# Patient Record
Sex: Female | Born: 1960 | Race: White | Hispanic: No | Marital: Married | Smoking: Never smoker
Health system: Southern US, Community
[De-identification: ages and names within clinical notes are randomized; demographics above are authoritative.]

## PROBLEM LIST (undated history)

## (undated) DIAGNOSIS — R87619 Unspecified abnormal cytological findings in specimens from cervix uteri: Secondary | ICD-10-CM

## (undated) DIAGNOSIS — E785 Hyperlipidemia, unspecified: Secondary | ICD-10-CM

## (undated) DIAGNOSIS — N952 Postmenopausal atrophic vaginitis: Secondary | ICD-10-CM

## (undated) DIAGNOSIS — D649 Anemia, unspecified: Secondary | ICD-10-CM

## (undated) DIAGNOSIS — I781 Nevus, non-neoplastic: Secondary | ICD-10-CM

## (undated) DIAGNOSIS — I7300 Raynaud's syndrome without gangrene: Secondary | ICD-10-CM

## (undated) HISTORY — DX: Anemia, unspecified: D64.9

## (undated) HISTORY — DX: Hyperlipidemia, unspecified: E78.5

## (undated) HISTORY — PX: CERVICAL BIOPSY  W/ LOOP ELECTRODE EXCISION: SUR135

## (undated) HISTORY — DX: Unspecified abnormal cytological findings in specimens from cervix uteri: R87.619

## (undated) HISTORY — PX: COLPOSCOPY: SHX161

## (undated) HISTORY — DX: Postmenopausal atrophic vaginitis: N95.2

## (undated) HISTORY — PX: NM RENAL LASIX (ARMC HX): HXRAD1213

## (undated) HISTORY — DX: Nevus, non-neoplastic: I78.1

## (undated) HISTORY — PX: COLONOSCOPY: SHX174

## (undated) HISTORY — DX: Raynaud's syndrome without gangrene: I73.00

---

## 1999-04-25 ENCOUNTER — Other Ambulatory Visit: Admission: RE | Admit: 1999-04-25 | Discharge: 1999-04-25 | Payer: Self-pay | Admitting: Obstetrics and Gynecology

## 1999-08-20 ENCOUNTER — Ambulatory Visit (HOSPITAL_COMMUNITY): Admission: RE | Admit: 1999-08-20 | Discharge: 1999-08-20 | Payer: Self-pay | Admitting: Obstetrics and Gynecology

## 1999-08-20 ENCOUNTER — Encounter: Payer: Self-pay | Admitting: Obstetrics and Gynecology

## 2000-07-13 ENCOUNTER — Other Ambulatory Visit: Admission: RE | Admit: 2000-07-13 | Discharge: 2000-07-13 | Payer: Self-pay | Admitting: Obstetrics and Gynecology

## 2001-03-23 ENCOUNTER — Encounter: Payer: Self-pay | Admitting: Obstetrics and Gynecology

## 2001-03-23 ENCOUNTER — Ambulatory Visit (HOSPITAL_COMMUNITY): Admission: RE | Admit: 2001-03-23 | Discharge: 2001-03-23 | Payer: Self-pay | Admitting: Obstetrics and Gynecology

## 2001-11-16 ENCOUNTER — Other Ambulatory Visit: Admission: RE | Admit: 2001-11-16 | Discharge: 2001-11-16 | Payer: Self-pay | Admitting: Obstetrics and Gynecology

## 2003-03-29 ENCOUNTER — Ambulatory Visit (HOSPITAL_COMMUNITY): Admission: RE | Admit: 2003-03-29 | Discharge: 2003-03-29 | Payer: Self-pay | Admitting: Obstetrics and Gynecology

## 2005-05-12 ENCOUNTER — Ambulatory Visit (HOSPITAL_COMMUNITY): Admission: RE | Admit: 2005-05-12 | Discharge: 2005-05-12 | Payer: Self-pay | Admitting: Obstetrics and Gynecology

## 2006-11-09 ENCOUNTER — Ambulatory Visit (HOSPITAL_COMMUNITY): Admission: RE | Admit: 2006-11-09 | Discharge: 2006-11-09 | Payer: Self-pay | Admitting: Obstetrics and Gynecology

## 2008-01-12 ENCOUNTER — Ambulatory Visit (HOSPITAL_COMMUNITY): Admission: RE | Admit: 2008-01-12 | Discharge: 2008-01-12 | Payer: Self-pay | Admitting: Obstetrics and Gynecology

## 2009-01-23 ENCOUNTER — Ambulatory Visit (HOSPITAL_COMMUNITY): Admission: RE | Admit: 2009-01-23 | Discharge: 2009-01-23 | Payer: Self-pay | Admitting: Obstetrics and Gynecology

## 2010-04-11 ENCOUNTER — Ambulatory Visit (HOSPITAL_COMMUNITY)
Admission: RE | Admit: 2010-04-11 | Discharge: 2010-04-11 | Payer: Self-pay | Source: Home / Self Care | Attending: Obstetrics and Gynecology | Admitting: Obstetrics and Gynecology

## 2011-04-03 ENCOUNTER — Other Ambulatory Visit (HOSPITAL_COMMUNITY): Payer: Self-pay | Admitting: Obstetrics & Gynecology

## 2011-04-03 DIAGNOSIS — Z1231 Encounter for screening mammogram for malignant neoplasm of breast: Secondary | ICD-10-CM

## 2011-04-25 ENCOUNTER — Ambulatory Visit (HOSPITAL_COMMUNITY)
Admission: RE | Admit: 2011-04-25 | Discharge: 2011-04-25 | Disposition: A | Discharge status: Home / Self Care | Payer: Commercial Indemnity | Source: Ambulatory Visit | Attending: Obstetrics & Gynecology | Admitting: Obstetrics & Gynecology

## 2011-04-25 ENCOUNTER — Other Ambulatory Visit: Payer: Self-pay | Admitting: Obstetrics & Gynecology

## 2011-04-25 DIAGNOSIS — N63 Unspecified lump in unspecified breast: Secondary | ICD-10-CM

## 2011-04-25 DIAGNOSIS — Z1231 Encounter for screening mammogram for malignant neoplasm of breast: Secondary | ICD-10-CM

## 2011-05-27 ENCOUNTER — Other Ambulatory Visit: Payer: Self-pay | Admitting: Obstetrics & Gynecology

## 2011-05-27 DIAGNOSIS — N63 Unspecified lump in unspecified breast: Secondary | ICD-10-CM

## 2011-06-04 ENCOUNTER — Ambulatory Visit
Admission: RE | Admit: 2011-06-04 | Discharge: 2011-06-04 | Disposition: A | Discharge status: Home / Self Care | Payer: Commercial Indemnity | Source: Ambulatory Visit | Attending: Obstetrics & Gynecology | Admitting: Obstetrics & Gynecology

## 2011-06-04 ENCOUNTER — Other Ambulatory Visit: Payer: Self-pay

## 2011-06-04 DIAGNOSIS — N63 Unspecified lump in unspecified breast: Secondary | ICD-10-CM

## 2011-06-17 ENCOUNTER — Ambulatory Visit (INDEPENDENT_AMBULATORY_CARE_PROVIDER_SITE_OTHER): Payer: Commercial Indemnity | Admitting: Family Medicine

## 2011-06-17 VITALS — BP 111/70 | HR 65 | Temp 98.3°F | Resp 16 | Ht 68.0 in | Wt 148.0 lb

## 2011-06-17 DIAGNOSIS — R059 Cough, unspecified: Secondary | ICD-10-CM

## 2011-06-17 DIAGNOSIS — R05 Cough: Secondary | ICD-10-CM

## 2011-06-17 MED ORDER — CEFDINIR 300 MG PO CAPS: 300.0000 mg | ORAL_CAPSULE | Freq: Two times a day (BID) | ORAL | Status: AC

## 2011-06-17 NOTE — Progress Notes (Signed)
  Patient Name: April Cowan Date of Birth: [birth year] Medical Record Number: [id number] Gender: female Date of Encounter: 06/17/2011  History of Present Illness:  April Cowan is a 51 y.o. very pleasant female patient who presents with the following:  Here with illness for a couple of days- she has been traveling and flying a lot.  She has a somewhat productive but painful cough, also sinus congestion and PND.  No earache or ST, no fever.  Some aches and a few chills.  No GI symptoms.   No tobacco.  She is generally quite healthy and has no other issues  There is no problem list on file for this patient.  No past medical history on file. No past surgical history on file. History  Substance Use Topics  . Smoking status: Never Smoker   . Smokeless tobacco: Not on file  . Alcohol Use: Not on file   No family history on file. No Known Allergies  Medication list has been reviewed and updated.  Review of Systems: As per HPI- otherwise negative.   Physical Examination: Filed Vitals:   06/17/11 1059  BP: 111/70  Pulse: 65  Temp: 98.3 F (36.8 C)  Resp: 16  Height: 5\' 8"  (1.727 m)  Weight: 148 lb (67.132 kg)  SpO2: 99%    Body mass index is 22.50 kg/(m^2).  GEN: WDWN, NAD, Non-toxic, A & O x 3 HEENT: Atraumatic, Normocephalic. Neck supple. No masses, No LAD.  TM, oropharynx, nasal cavity wnl, PEERL Ears and Nose: No external deformity. CV: RRR, No M/G/R. No JVD. No thrill. No extra heart sounds. PULM: CTA B, no wheezes, crackles, rhonchi. No retractions. No resp. distress. No accessory muscle use. ABD: S, NT, ND, +BS. No rebound. No HSM. EXTR: No c/c/e NEURO Normal gait.  PSYCH: Normally interactive. Conversant. Not depressed or anxious appearing.  Calm demeanor.    Assessment and Plan: 1. Cough  cefdinir (OMNICEF) 300 MG capsule   Suspect that April Cowan currently has a viral infection- however I did give her a paper Rx for omincef to hold on to- she can use this if  she is not better in the next few days.  Feel free to call if not getting better- Sooner if worse.   For now continue supportive measures and OTC medications

## 2012-05-27 ENCOUNTER — Other Ambulatory Visit: Payer: Self-pay

## 2012-05-27 DIAGNOSIS — Z1231 Encounter for screening mammogram for malignant neoplasm of breast: Secondary | ICD-10-CM

## 2012-06-22 ENCOUNTER — Ambulatory Visit
Admission: RE | Admit: 2012-06-22 | Discharge: 2012-06-22 | Disposition: A | Discharge status: Home / Self Care | Payer: Commercial Indemnity | Source: Ambulatory Visit

## 2012-06-22 DIAGNOSIS — Z1231 Encounter for screening mammogram for malignant neoplasm of breast: Secondary | ICD-10-CM

## 2013-05-09 ENCOUNTER — Encounter: Payer: Self-pay | Admitting: Family Medicine

## 2013-05-09 ENCOUNTER — Ambulatory Visit (INDEPENDENT_AMBULATORY_CARE_PROVIDER_SITE_OTHER): Payer: Commercial Indemnity | Admitting: Family Medicine

## 2013-05-09 ENCOUNTER — Encounter (INDEPENDENT_AMBULATORY_CARE_PROVIDER_SITE_OTHER): Payer: Self-pay

## 2013-05-09 VITALS — BP 90/64 | Temp 97.9°F | Ht 68.0 in | Wt 147.0 lb

## 2013-05-09 DIAGNOSIS — Z7189 Other specified counseling: Secondary | ICD-10-CM

## 2013-05-09 DIAGNOSIS — Z7689 Persons encountering health services in other specified circumstances: Secondary | ICD-10-CM

## 2013-05-09 DIAGNOSIS — Z1211 Encounter for screening for malignant neoplasm of colon: Secondary | ICD-10-CM

## 2013-05-09 NOTE — Patient Instructions (Addendum)
-  We placed a referral for you as discussed. It usually takes about 1-2 weeks to process and schedule this referral. If you have not heard from [place] regarding this appointment in 2 weeks please contact our office.  -1000 IU of Vit D3 daily; 1200mg  daily calcium total in food and in supplement  -PLEASE SIGN UP FOR [place]   We recommend the following healthy lifestyle measures: - eat a healthy diet consisting of lots of vegetables, fruits, beans, nuts, seeds, healthy meats such as white chicken and fish and whole grains.  - avoid fried foods, fast food, processed foods, sodas, red meet and other fattening foods.  - get a least 150 minutes of aerobic exercise per week.   Follow up in: 1 year or as needed

## 2013-05-09 NOTE — Progress Notes (Signed)
Pre visit review using our clinic review tool, if applicable. No additional management support is needed unless otherwise documented below in the visit note.,  

## 2013-05-09 NOTE — Progress Notes (Signed)
Chief Complaint  Patient presents with  . Establish Care    HPI:  April Cowan is here to establish care.  Last PCP and physical: Sees Dr. Lonia Skinner - UTD on physicals and labs.  Has the following chronic problems and concerns today:  There are no active problems to display for this patient.  Wants to get colon cancer screening.  Health Maintenance: -she will find out about tdap  ROS: See pertinent positives and negatives per HPI.  Past Medical History  Diagnosis Date  . Vaginal atrophy   . Spider veins     Family History  Problem Relation Age of Onset  . Arthritis Paternal Grandmother   . Colon cancer Paternal Grandmother   . Breast cancer Paternal Grandmother   . Lung cancer Paternal Grandfather   . Hyperlipidemia Mother   . Heart disease Father   . Hypertension Father   . Atrial fibrillation Father     History   Social History  . Marital Status: Married    Spouse Name: N/A    Number of Children: N/A  . Years of Education: N/A   Social History Main Topics  . Smoking status: Never Smoker   . Smokeless tobacco: None  . Alcohol Use: Yes     Comment: occ  . Drug Use: None  . Sexual Activity: None   Other Topics Concern  . 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  EXAMDanley Danker Vitals:   05/09/13 0828  BP: 90/64  Temp: 97.9 F (36.6 C)    Body mass index is 22.36 kg/(m^2).  GENERAL: vitals reviewed and listed above, alert, oriented, appears well hydrated and in no acute distress  HEENT: atraumatic, conjunttiva clear, no obvious abnormalities on inspection of external nose and ears  NECK: no obvious masses on inspection  LUNGS: clear to auscultation bilaterally, no wheezes, rales or rhonchi, good air  movement  CV: HRRR, no peripheral edema  MS: moves all extremities without noticeable abnormality  PSYCH: pleasant and cooperative, no obvious depression or anxiety  ASSESSMENT AND PLAN:  Discussed the following assessment and plan:  Colon cancer screening - Plan: Ambulatory referral to Gastroenterology  Encounter to establish care  -We reviewed the PMH, PSH, FH, SH, Meds and Allergies. -We provided refills for any medications we will prescribe as needed. -We addressed current concerns per orders and patient instructions. -We have asked for records for pertinent exams, studies, vaccines and notes from previous providers. -We have advised patient to follow up per instructions below. -referred for colonoscopy  -Patient advised to return or notify a doctor immediately if symptoms worsen or persist or new concerns arise.  Patient Instructions  -We placed a referral for you as discussed. It usually takes about 1-2 weeks to process and schedule this referral. If you have not heard from [place] regarding this appointment in 2 weeks please contact our office.  -1000 IU of Vit D3 daily; 1200mg  daily calcium total in food and in supplement  -PLEASE SIGN UP FOR [place]   We recommend the following healthy lifestyle measures: - eat a healthy diet consisting of lots of vegetables, fruits, beans, nuts, seeds, healthy meats  such as white chicken and fish and whole grains.  - avoid fried foods, fast food, processed foods, sodas, red meet and other fattening foods.  - get a least 150 minutes of aerobic exercise per week.   Follow up in: 1 year or as needed      Faron Whitelock, Jarrett Soho R.

## 2013-07-11 ENCOUNTER — Encounter: Payer: Self-pay | Admitting: Family Medicine

## 2013-08-11 ENCOUNTER — Other Ambulatory Visit: Payer: Self-pay

## 2013-08-11 DIAGNOSIS — Z1231 Encounter for screening mammogram for malignant neoplasm of breast: Secondary | ICD-10-CM

## 2013-08-19 ENCOUNTER — Encounter (INDEPENDENT_AMBULATORY_CARE_PROVIDER_SITE_OTHER): Payer: Self-pay

## 2013-08-19 ENCOUNTER — Ambulatory Visit
Admission: RE | Admit: 2013-08-19 | Discharge: 2013-08-19 | Disposition: A | Discharge status: Home / Self Care | Payer: Managed Care, Other (non HMO) | Source: Ambulatory Visit

## 2013-08-19 DIAGNOSIS — Z1231 Encounter for screening mammogram for malignant neoplasm of breast: Secondary | ICD-10-CM

## 2015-02-01 ENCOUNTER — Encounter (HOSPITAL_BASED_OUTPATIENT_CLINIC_OR_DEPARTMENT_OTHER): Payer: Self-pay | Admitting: *Deleted

## 2015-02-01 ENCOUNTER — Emergency Department (HOSPITAL_BASED_OUTPATIENT_CLINIC_OR_DEPARTMENT_OTHER): Payer: Managed Care, Other (non HMO)

## 2015-02-01 ENCOUNTER — Emergency Department (HOSPITAL_BASED_OUTPATIENT_CLINIC_OR_DEPARTMENT_OTHER)
Admission: EM | Admit: 2015-02-01 | Discharge: 2015-02-01 | Disposition: A | Discharge status: Home / Self Care | Payer: Managed Care, Other (non HMO) | Attending: Emergency Medicine | Admitting: Emergency Medicine

## 2015-02-01 DIAGNOSIS — R202 Paresthesia of skin: Secondary | ICD-10-CM | POA: Insufficient documentation

## 2015-02-01 DIAGNOSIS — R200 Anesthesia of skin: Secondary | ICD-10-CM | POA: Diagnosis not present

## 2015-02-01 DIAGNOSIS — R000 Tachycardia, unspecified: Secondary | ICD-10-CM | POA: Diagnosis not present

## 2015-02-01 DIAGNOSIS — Z87448 Personal history of other diseases of urinary system: Secondary | ICD-10-CM | POA: Diagnosis not present

## 2015-02-01 DIAGNOSIS — Z563 Stressful work schedule: Secondary | ICD-10-CM | POA: Insufficient documentation

## 2015-02-01 DIAGNOSIS — Z8679 Personal history of other diseases of the circulatory system: Secondary | ICD-10-CM | POA: Diagnosis not present

## 2015-02-01 DIAGNOSIS — Z566 Other physical and mental strain related to work: Secondary | ICD-10-CM

## 2015-02-01 DIAGNOSIS — R002 Palpitations: Secondary | ICD-10-CM | POA: Insufficient documentation

## 2015-02-01 LAB — BASIC METABOLIC PANEL
Anion gap: 9 (ref 5–15)
BUN: 14 mg/dL (ref 6–20)
CO2: 29 mmol/L (ref 22–32)
Calcium: 9.4 mg/dL (ref 8.9–10.3)
Chloride: 102 mmol/L (ref 101–111)
Creatinine, Ser: 0.73 mg/dL (ref 0.44–1.00)
GFR calc Af Amer: >60 mL/min (ref >60)
GFR calc non Af Amer: >60 mL/min (ref >60)
Glucose, Bld: 130 mg/dL — ABNORMAL HIGH (ref 65–99)
Potassium: 4.3 mmol/L (ref 3.5–5.1)
Sodium: 140 mmol/L (ref 135–145)

## 2015-02-01 LAB — CBC WITH DIFFERENTIAL/PLATELET
Basophils Absolute: 0 10*3/uL (ref 0.0–0.1)
Basophils Relative: 0 %
Eosinophils Absolute: 0.1 10*3/uL (ref 0.0–0.7)
Eosinophils Relative: 1 %
HCT: 41 % (ref 36.0–46.0)
Hemoglobin: 13.8 g/dL (ref 12.0–15.0)
Lymphocytes Relative: 20 %
Lymphs Abs: 1.3 10*3/uL (ref 0.7–4.0)
MCH: 32 pg (ref 26.0–34.0)
MCHC: 33.7 g/dL (ref 30.0–36.0)
MCV: 95.1 fL (ref 78.0–100.0)
Monocytes Absolute: 0.5 10*3/uL (ref 0.1–1.0)
Monocytes Relative: 8 %
Neutro Abs: 4.6 10*3/uL (ref 1.7–7.7)
Neutrophils Relative %: 71 %
Platelets: 196 10*3/uL (ref 150–400)
RBC: 4.31 MIL/uL (ref 3.87–5.11)
RDW: 11.8 % (ref 11.5–15.5)
WBC: 6.4 10*3/uL (ref 4.0–10.5)

## 2015-02-01 LAB — TROPONIN I: Troponin I: <0.03 ng/mL (ref <0.031)

## 2015-02-01 NOTE — Discharge Instructions (Signed)

## 2015-02-01 NOTE — ED Provider Notes (Signed)
CSN: [id number]     Arrival date & time 02/01/15  0910 History   First MD Initiated Contact with Patient 02/01/15 [phone]     No chief complaint on file.    (Consider location/radiation/quality/duration/timing/severity/associated sxs/prior Treatment) HPI Comments: Patient is a 54 year old female with no prior medical history, takes no medications at home, no cigarette use and drinks 2 glasses of wine per day who is presenting today with one month of left arm tingling and numbness which was present today when she woke up that developed into palpitations while she was driving to work at a general sense of not feeling well. Patient denied any chest pain at any point of time, nausea, vomiting or shortness of breath. She states she's been under an incredible amount of stress lately at work for the last 1 month which she is concerned may be part of the problem. She has a history of Reynauds disease but denies any left arm swelling, weakness. No lower extremity symptoms or headache. No significant family history for MI. Patient last saw her PCP about 1-1/2 years ago and everything was normal at that time. Denies excessive weight gain or weight loss.  The history is provided by the patient and the spouse.    Past Medical History  Diagnosis Date  . Vaginal atrophy   . Spider veins    History reviewed. No pertinent past surgical history. Family History  Problem Relation Age of Onset  . Arthritis Paternal Grandmother   . Colon cancer Paternal Grandmother   . Breast cancer Paternal Grandmother   . Lung cancer Paternal Grandfather   . Hyperlipidemia Mother   . Heart disease Father   . Hypertension Father   . Atrial fibrillation Father    Social History  Substance Use Topics  . Smoking status: Never Smoker   . Smokeless tobacco: None  . Alcohol Use: Yes     Comment: occ   OB History    No data available     Review of Systems  All other systems reviewed and are negative.     Allergies   Review of patient's allergies indicates no known allergies.  Home Medications   Prior to Admission medications   Not on File   BP 113/80 mmHg  Pulse 80  Temp(Src) 98.3 F (36.8 C) (Oral)  Resp 16  SpO2 98%  LMP 04/07/2012 Physical Exam  Constitutional: She is oriented to person, place, and time. [name]. No distress.  HENT:  Head: Normocephalic and atraumatic.  Mouth/Throat: Oropharynx is clear and moist.  Eyes: Conjunctivae and EOM are normal. Pupils are equal, round, and reactive to light.  Neck: Normal range of motion. Neck supple.  Cardiovascular: Regular rhythm and intact distal pulses.  Tachycardia present.   No murmur heard. Pulses are equal in bilateral upper extremities. It initially appears anxious and worried however tachycardia improves with rest and relaxing  Pulmonary/Chest: Effort normal and breath sounds normal. No respiratory distress. She has no wheezes. She has no rales.  Abdominal: Soft. She exhibits no distension. There is no tenderness. There is no rebound and no guarding.  Musculoskeletal: Normal range of motion. She exhibits no edema or tenderness.  Neurological: She is alert and oriented to person, place, and time.  Skin: Skin is warm and dry. No rash noted. No erythema.  Psychiatric: She has a normal mood and affect. Her behavior is normal.  Nursing note and vitals reviewed.   ED Course  Procedures (including critical care time) Labs  Review Labs Reviewed  BASIC METABOLIC PANEL - Abnormal; Notable for the following:    Glucose, Bld 130 (*)    All other components within normal limits  CBC WITH DIFFERENTIAL/PLATELET  TROPONIN I  TSH    Imaging Review Dg Chest 2 View  02/01/2015  CLINICAL DATA:  Left-sided chest pain and tachycardia. Left arm numbness. EXAM: CHEST  2 VIEW COMPARISON:  None. FINDINGS: Lungs are clear. Heart size and pulmonary vascularity are normal. No adenopathy. There is thoracolumbar  levoscoliosis. No pneumothorax. IMPRESSION: No edema or consolidation. Electronically Signed   By: Lowella Grip III M.D.   On: 02/01/2015 10:13   I have personally reviewed and evaluated these images and lab results as part of my medical decision-making.   EKG Interpretation   Date/Time:  Thursday February 01 2015 10:19:01 EST Ventricular Rate:  105 PR Interval:  150 QRS Duration: 76 QT Interval:  360 QTC Calculation: 475 R Axis:   77 Text Interpretation:  Sinus tachycardia with occasional Premature  ventricular complexes Otherwise normal ECG No previous tracing Confirmed  by Maryan Rued  MD, Loree Fee ([id number]) on 02/01/2015 9:25:05 AM      MDM   Final diagnoses:  Palpitation  Stress at work    Patient is a 54 year old female with no significant medical history who presents today with one month of intermittent arm tingling and discomfort in the development of palpitations today that sounds most like a panic attack. Per her husband she has been under a significant amount of stress lately at work which is affecting her entire life. She has no prior medical history or cardiac risk factors. Her arm symptoms over the last month have never been exertional in nature. They're usually more present in the morning when she wakes up or when she relaxes in the evening. She has been drinking at least 2 glasses of wine daily for the last month. Heart score of 1 for age only. She denies any chest pain. Low suspicion for dissection. No risk factors for PE. EKG shows a sinus tachycardia with occasional PVCs. With rest alone patient's heart rate improves to the 80s and repeat EKG was within normal limits. Low suspicion at this time for coronary artery dissection or left arm DVT or arterial obstruction.  Chest x-ray within normal limits, CBC, BMP and troponin are within normal limits. TSH is pending. Feel that patient is safe to go home. Discussed with her symptoms are ongoing she needs to follow-up with her  doctor for further evaluation and may even require stress testing in the future    Blanchie Dessert, MD 02/02/15 [phone]

## 2015-02-01 NOTE — ED Notes (Addendum)
C/o tingling in left arm x 1 month.  C/o stress lately.  C/o now of aching in left arm and rapid hrt rate. No sob or nausea. No pain in chest. Pt took she took a baby asa this am.

## 2015-02-02 ENCOUNTER — Other Ambulatory Visit: Payer: Self-pay

## 2015-02-02 ENCOUNTER — Ambulatory Visit
Admission: RE | Admit: 2015-02-02 | Discharge: 2015-02-02 | Disposition: A | Discharge status: Home / Self Care | Payer: Managed Care, Other (non HMO) | Source: Ambulatory Visit

## 2015-02-02 DIAGNOSIS — Z1231 Encounter for screening mammogram for malignant neoplasm of breast: Secondary | ICD-10-CM

## 2015-02-02 LAB — TSH: TSH: 1.283 u[IU]/mL (ref 0.350–4.500)

## 2015-06-18 ENCOUNTER — Encounter: Payer: Managed Care, Other (non HMO) | Admitting: Family Medicine

## 2015-06-22 ENCOUNTER — Encounter: Payer: Managed Care, Other (non HMO) | Admitting: Family Medicine

## 2015-11-30 ENCOUNTER — Ambulatory Visit (INDEPENDENT_AMBULATORY_CARE_PROVIDER_SITE_OTHER): Payer: Managed Care, Other (non HMO) | Admitting: Family Medicine

## 2015-11-30 ENCOUNTER — Encounter: Payer: Self-pay | Admitting: Family Medicine

## 2015-11-30 VITALS — BP 116/80 | HR 81 | Temp 98.1°F | Ht 67.0 in | Wt 147.5 lb

## 2015-11-30 DIAGNOSIS — Z23 Encounter for immunization: Secondary | ICD-10-CM

## 2015-11-30 DIAGNOSIS — Z0000 Encounter for general adult medical examination without abnormal findings: Secondary | ICD-10-CM | POA: Diagnosis not present

## 2015-11-30 LAB — LIPID PANEL
Cholesterol: 247 mg/dL — ABNORMAL HIGH (ref 0–200)
HDL: 101.2 mg/dL (ref >39.00)
LDL Cholesterol: 132 mg/dL — ABNORMAL HIGH (ref 0–99)
NonHDL: 146.18
Total CHOL/HDL Ratio: 2
Triglycerides: 70 mg/dL (ref 0.0–149.0)
VLDL: 14 mg/dL (ref 0.0–40.0)

## 2015-11-30 LAB — HEMOGLOBIN A1C: Hgb A1c MFr Bld: 5.1 % (ref 4.6–6.5)

## 2015-11-30 NOTE — Addendum Note (Signed)
Addended by: Agnes Lawrence on: 11/30/2015 09:49 AM   Modules accepted: Orders

## 2015-11-30 NOTE — Progress Notes (Signed)
HPI:  Here for CPE:  -Concerns and/or follow up today: none Had cbc, cmp and thyroid 03/2015 with [facility] - normal. Had some anxiety last year and one panic attack her whole life and had eval in ER - exercise and relaxation have helped and other then some generalized mild worry and sleep issues is doing well.  -Diet: variety of foods, balance and well rounded  -Exercise: regular exercise  -Taking folic acid, vitamin D or calcium: no  -Diabetes and Dyslipidemia Screening: labs today  -Hx of HTN: no  -Vaccines: wants to do flu and tdap today  -pap history: sees Dr. Lonia Skinner - does women's exams, paps, breast exams and mammos with gyn  -sexual activity: yes, female partner, no new partners  -wants STI testing (Hep C if born [birth year]): wants hiv and hep c  -FH breast, colon or ovarian ca: see FH Last mammogram: 01/2015 birads 1 Last colon cancer screening: not done, wants to do cologuard  -Alcohol, Tobacco, drug use: see social history  Review of Systems - no fevers, unintentional weight loss, vision loss, hearing loss, chest pain, sob, hemoptysis, melena, hematochezia, hematuria, genital discharge, changing or concerning skin lesions, bleeding, bruising, loc, thoughts of self harm or SI  Past Medical History:  Diagnosis Date  . Spider veins   . Vaginal atrophy     No past surgical history on file.  Family History  Problem Relation Age of Onset  . Arthritis Paternal Grandmother   . Colon cancer Paternal Grandmother   . Breast cancer Paternal Grandmother   . Lung cancer Paternal Grandfather   . Hyperlipidemia Mother   . Heart disease Father   . Hypertension Father   . Atrial fibrillation Father     Social History   Social History  . Marital status: Married    Spouse name: N/A  . Number of children: N/A  . Years of education: N/A   Social History Main Topics  . Smoking status: Never Smoker  . Smokeless tobacco: None  . Alcohol use Yes     Comment: occ   . Drug use: Unknown  . Sexual activity: Not Asked   Other Topics Concern  . 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  EXAM:  Vitals:   11/30/15 0855  BP: 116/80  Pulse: 81  Temp: 98.1 F (36.7 C)    GENERAL: vitals reviewed and listed below, alert, oriented, appears well hydrated and in no acute distress  HEENT: head atraumatic, PERRLA, normal appearance of eyes, ears, nose and mouth. moist mucus membranes.  NECK: supple, no masses or lymphadenopathy  LUNGS: clear to auscultation bilaterally, no rales, rhonchi or wheeze  CV: HRRR, no peripheral edema or cyanosis, normal pedal pulses  BREAST: declined, does with gyn  ABDOMEN: bowel sounds normal, soft, non tender to palpation, no masses, no rebound or guarding  GU: declined, does with gyn  SKIN: no rash or abnormal lesions, declined full skin exam - does with gyn  MS: normal gait, moves all extremities normally  NEURO: normal gait, speech and thought processing grossly intact, muscle tone grossly intact throughout  PSYCH: normal affect, pleasant and cooperative  ASSESSMENT AND PLAN:  Discussed the following assessment and plan:  Visit for preventive health examination - Plan: Hep C Antibody, HIV antibody (with reflex), Lipid Panel, Hemoglobin A1c  -Discussed and advised all [place] preventive services health task force level A and B recommendations for age, sex and risks.  -Advised at least 150 minutes of exercise per week and a healthy diet with avoidance of (less then 1 serving per week) processed foods, white starches, red meat, fast foods and sweets and consisting of: * 5-9 servings of fresh fruits and vegetables (not corn or potatoes) *nuts and seeds, beans *olives and olive  oil *lean meats such as fish and white chicken  *whole grains  -labs, studies and vaccines per orders this encounter  Orders Placed This Encounter  Procedures  . Hep C Antibody  . HIV antibody (with reflex)  . Lipid Panel  . Hemoglobin A1c    Patient advised to return to clinic immediately if symptoms worsen or persist or new concerns.  Patient Instructions  BEFORE YOU LEAVE: -follow up: yearly and as needed -flu shot, Tdap -order cologuard  Vit D3 [phone] IU daily  We have ordered labs or studies at this visit. It can take up to 1-2 weeks for results and processing. IF results require follow up or explanation, we will call you with instructions. Clinically stable results will be released to your [facility]. If you have not heard from [place] or cannot find your results in [facility] in 2 weeks please contact our office at [phone].  If you are not yet signed up for [facility], please consider signing up.   We recommend the following healthy lifestyle for LIFE: 1) Small portions.   Tip: eat off of a salad plate instead of a dinner plate.  Tip: It is ok to feel hungry after a meal - that likely means you ate an appropriate portion.  Tip: if you need more or a snack choose fruits, veggies and/or a handful of nuts or seeds.  2) Eat a healthy clean diet.  * Tip: Avoid (less then 1 serving per week): processed foods, sweets, sweetened drinks, white starches (rice, flour, bread, potatoes, pasta, etc), red meat, fast foods, butter  *Tip: CHOOSE instead   * 5-9 servings per day of fresh or frozen fruits and vegetables (but not corn, potatoes, bananas, canned or dried fruit)   *nuts and seeds, beans   *olives and olive oil   *small portions of lean meats such as fish and white chicken    *small portions of whole grains  3)Get at least 150 minutes of sweaty aerobic exercise per week.  4)Reduce stress - consider counseling, meditation and relaxation to balance other aspects of your  life.   FOR IMPROVED SLEEP AND TO RESET YOUR SLEEP SCHEDULE: []  exercise 30 minutes daily  []  go to bed and wake up at the same time  []  keep bedroom cool, dark and quiet  []  reserve bed for sleep - do not read, watch TV, etc in bed  []  If you toss and turn more then 15-20 minutes get out of bed and list thoughts/do quite activity then go back to bed; repeat as needed; do not worry about when you eventually fall asleep - still get up at the same time and turn on lights and take shower  [] get counseling, cognitive behavioral therapy - there are several phone apps for this if you are not able to see a therapist  []  some people find that a half dose of benadryl, melatonin, tylenol  pm or unisom on a few nights per week is helpful initially for a few weeks  [] seek help and treat any depression or anxiety  [] prescription strength sleep medications should only be used in severe cases of insomnia if other measures fail and should be used sparingly        No Follow-up on file.  Colin Benton R., DO

## 2015-11-30 NOTE — Patient Instructions (Signed)
BEFORE YOU LEAVE: -follow up: yearly and as needed -flu shot, Tdap -order cologuard  Vit D3 [phone] IU daily  We have ordered labs or studies at this visit. It can take up to 1-2 weeks for results and processing. IF results require follow up or explanation, we will call you with instructions. Clinically stable results will be released to your [facility]. If you have not heard from [place] or cannot find your results in [facility] in 2 weeks please contact our office at [phone].  If you are not yet signed up for [facility], please consider signing up.   We recommend the following healthy lifestyle for LIFE: 1) Small portions.   Tip: eat off of a salad plate instead of a dinner plate.  Tip: It is ok to feel hungry after a meal - that likely means you ate an appropriate portion.  Tip: if you need more or a snack choose fruits, veggies and/or a handful of nuts or seeds.  2) Eat a healthy clean diet.  * Tip: Avoid (less then 1 serving per week): processed foods, sweets, sweetened drinks, white starches (rice, flour, bread, potatoes, pasta, etc), red meat, fast foods, butter  *Tip: CHOOSE instead   * 5-9 servings per day of fresh or frozen fruits and vegetables (but not corn, potatoes, bananas, canned or dried fruit)   *nuts and seeds, beans   *olives and olive oil   *small portions of lean meats such as fish and white chicken    *small portions of whole grains  3)Get at least 150 minutes of sweaty aerobic exercise per week.  4)Reduce stress - consider counseling, meditation and relaxation to balance other aspects of your life.   FOR IMPROVED SLEEP AND TO RESET YOUR SLEEP SCHEDULE: []  exercise 30 minutes daily  []  go to bed and wake up at the same time  []  keep bedroom cool, dark and quiet  []  reserve bed for sleep - do not read, watch TV, etc in bed  []  If you toss and turn more then 15-20 minutes get out of bed and list thoughts/do quite activity then go back to bed; repeat as needed; do  not worry about when you eventually fall asleep - still get up at the same time and turn on lights and take shower  [] get counseling, cognitive behavioral therapy - there are several phone apps for this if you are not able to see a therapist  []  some people find that a half dose of benadryl, melatonin, tylenol pm or unisom on a few nights per week is helpful initially for a few weeks  [] seek help and treat any depression or anxiety  [] prescription strength sleep medications should only be used in severe cases of insomnia if other measures fail and should be used sparingly

## 2015-12-01 LAB — HEPATITIS C ANTIBODY: HCV Ab: NEGATIVE

## 2015-12-01 LAB — HIV ANTIBODY (ROUTINE TESTING W REFLEX): HIV 1&2 Ab, 4th Generation: NONREACTIVE

## 2016-03-14 ENCOUNTER — Other Ambulatory Visit: Payer: Self-pay | Admitting: Obstetrics & Gynecology

## 2016-03-14 DIAGNOSIS — Z1231 Encounter for screening mammogram for malignant neoplasm of breast: Secondary | ICD-10-CM

## 2016-04-10 ENCOUNTER — Ambulatory Visit: Payer: Managed Care, Other (non HMO)

## 2016-04-15 ENCOUNTER — Ambulatory Visit: Payer: Managed Care, Other (non HMO)

## 2016-04-15 ENCOUNTER — Other Ambulatory Visit: Payer: Self-pay | Admitting: Obstetrics & Gynecology

## 2016-04-15 ENCOUNTER — Ambulatory Visit
Admission: RE | Admit: 2016-04-15 | Discharge: 2016-04-15 | Disposition: A | Discharge status: Home / Self Care | Payer: Managed Care, Other (non HMO) | Source: Ambulatory Visit | Attending: Obstetrics & Gynecology | Admitting: Obstetrics & Gynecology

## 2016-04-15 DIAGNOSIS — Z1231 Encounter for screening mammogram for malignant neoplasm of breast: Secondary | ICD-10-CM

## 2016-04-15 DIAGNOSIS — N6320 Unspecified lump in the left breast, unspecified quadrant: Secondary | ICD-10-CM

## 2016-04-16 ENCOUNTER — Ambulatory Visit
Admission: RE | Admit: 2016-04-16 | Discharge: 2016-04-16 | Disposition: A | Discharge status: Home / Self Care | Payer: Managed Care, Other (non HMO) | Source: Ambulatory Visit | Attending: Obstetrics & Gynecology | Admitting: Obstetrics & Gynecology

## 2016-04-16 DIAGNOSIS — N6320 Unspecified lump in the left breast, unspecified quadrant: Secondary | ICD-10-CM

## 2016-06-23 ENCOUNTER — Telehealth: Payer: Self-pay | Admitting: Family Medicine

## 2016-06-23 NOTE — Telephone Encounter (Signed)
Pt would like to have a referral to a GI doctor that Dr. Maudie Mercury knows and refers out to.

## 2016-06-24 NOTE — Telephone Encounter (Signed)
I left a detailed message with the information below at the pts cell number. 

## 2016-06-24 NOTE — Telephone Encounter (Signed)
May need appt to find out what is going on and help with appropriate referral if needed. Thanks!

## 2016-12-11 ENCOUNTER — Encounter: Payer: Self-pay | Admitting: Family Medicine

## 2017-01-12 ENCOUNTER — Ambulatory Visit (INDEPENDENT_AMBULATORY_CARE_PROVIDER_SITE_OTHER): Payer: Managed Care, Other (non HMO) | Admitting: Family Medicine

## 2017-01-12 ENCOUNTER — Encounter: Payer: Self-pay | Admitting: Family Medicine

## 2017-01-12 VITALS — BP 128/70 | HR 38 | Temp 97.5°F | Ht 67.0 in | Wt 152.6 lb

## 2017-01-12 DIAGNOSIS — Z1211 Encounter for screening for malignant neoplasm of colon: Secondary | ICD-10-CM

## 2017-01-12 DIAGNOSIS — R21 Rash and other nonspecific skin eruption: Secondary | ICD-10-CM

## 2017-01-12 MED ORDER — TRIAMCINOLONE ACETONIDE 0.025 % EX OINT: 1.0000 "application " | TOPICAL_OINTMENT | Freq: Two times a day (BID) | CUTANEOUS | 0 refills | Status: DC

## 2017-01-12 NOTE — Patient Instructions (Signed)
BEFORE YOU LEAVE: -please refer her for colonosocpy for colon cancer screening  Zyrtec daily for several weeks.  Use the triamcinolone cream as needed 1-2 times daily.  Inspect for insects if you continue to have issues.  I hope you are feeling better soon! Seek care immediately if worsening, new concerns or you are not improving with treatment.

## 2017-01-12 NOTE — Progress Notes (Signed)
HPI:  Acute visit for itchy rash: -back, arms, chest -started this week after being outside all day working on a car and also washed her clothes with husband's clothes after he was in possible poison ivy -multiple scattered itchy bumps -has pet in the house but has been treated and inspected for fleas -denies tick bite, fevers, malaise, headache, any other symptons  ROS: See pertinent positives and negatives per HPI.  Past Medical History:  Diagnosis Date  . Spider veins   . Vaginal atrophy     No past surgical history on file.  Family History  Problem Relation Age of Onset  . Arthritis Paternal Grandmother   . Colon cancer Paternal Grandmother   . Breast cancer Paternal Grandmother   . Lung cancer Paternal Grandfather   . Hyperlipidemia Mother   . Heart disease Father   . Hypertension Father   . Atrial fibrillation Father     Social History   Social History  . Marital status: Married    Spouse name: N/A  . Number of children: N/A  . Years of education: N/A   Social History Main Topics  . Smoking status: Never Smoker  . Smokeless tobacco: Never Used  . Alcohol use Yes     Comment: occ  . Drug use: Unknown  . Sexual activity: Not Asked   Other Topics Concern  . None   Social History Narrative   Work or School: Patent attorney Situation: lives with husband and daughter      Spiritual Beliefs: Christian      Lifestyle: exercises a few days per week, diet is good              Current Outpatient Prescriptions:  .  Cholecalciferol (VITAMIN D PO), Take by mouth daily., Disp: , Rfl:  .  Omega-3 Fatty Acids (OMEGA 3 PO), Take by mouth daily., Disp: , Rfl:  .  triamcinolone (KENALOG) 0.025 % ointment, Apply 1 application topically 2 (two) times daily., Disp: 30 g, Rfl: 0  EXAM:  Vitals:   01/12/17 1350  BP: 128/70  Pulse: (!) 38  Temp: (!) 97.5 F (36.4 C)    Body mass index is 23.9 kg/m.  GENERAL: vitals reviewed and listed above, alert,  oriented, appears well hydrated and in no acute distress  HEENT: atraumatic, conjunttiva clear, no obvious abnormalities on inspection of external nose and ears  NECK: no obvious masses on inspection  LUNGS: clear to auscultation bilaterally, no wheezes, rales or rhonchi, good air movement  CV: HRRR, no peripheral edema  MS: moves all extremities without noticeable abnormality  PSYCH: pleasant and cooperative, no obvious depression or anxiety  ASSESSMENT AND PLAN:  Discussed the following assessment and plan:  Rash -suspect insect bites contact dermatitis and decided to treat with longer acting antihistamine and topical steroid cream -advised her to return if symptoms worsen, new concerns arise or this persists despite treatment -Her pulse was low today on check, I I was not made aware of this until noted it after she had left. I spoke with my assistant who reported she the patient told her pulse has always been like this and she feels fine so my assistant did not inform me. Our usual procedure is for her to inform me of any abnormal vitals on check in so that I can recheck. We'll have patient recheck at home and have her follow-up if truly is this low for further evaluation. Phone call placed to patient with these  instructions. -she is due for colon cancer screening - advised referral for this -Patient advised to return or notify a doctor immediately if symptoms worsen or persist or new concerns arise.  Patient Instructions  BEFORE YOU LEAVE: -please refer her for colonosocpy for colon cancer screening  Zyrtec daily for several weeks.  Use the triamcinolone cream as needed 1-2 times daily.  Inspect for insects if you continue to have issues.  I hope you are feeling better soon! Seek care immediately if worsening, new concerns or you are not improving with treatment.      Colin Benton R., DO

## 2017-02-16 ENCOUNTER — Encounter: Payer: Self-pay | Admitting: Family Medicine

## 2017-04-02 ENCOUNTER — Encounter: Payer: Self-pay | Admitting: Family Medicine

## 2017-05-01 ENCOUNTER — Other Ambulatory Visit: Payer: Self-pay | Admitting: Obstetrics & Gynecology

## 2017-05-01 DIAGNOSIS — Z1231 Encounter for screening mammogram for malignant neoplasm of breast: Secondary | ICD-10-CM

## 2017-05-20 ENCOUNTER — Ambulatory Visit
Admission: RE | Admit: 2017-05-20 | Discharge: 2017-05-20 | Disposition: A | Discharge status: Home / Self Care | Payer: Managed Care, Other (non HMO) | Source: Ambulatory Visit | Attending: Obstetrics & Gynecology | Admitting: Obstetrics & Gynecology

## 2017-05-20 DIAGNOSIS — Z1231 Encounter for screening mammogram for malignant neoplasm of breast: Secondary | ICD-10-CM

## 2017-07-03 ENCOUNTER — Encounter: Payer: Self-pay | Admitting: Obstetrics & Gynecology

## 2017-07-03 ENCOUNTER — Ambulatory Visit (INDEPENDENT_AMBULATORY_CARE_PROVIDER_SITE_OTHER): Payer: Managed Care, Other (non HMO) | Admitting: Obstetrics & Gynecology

## 2017-07-03 VITALS — BP 102/68 | Ht 67.25 in | Wt 150.0 lb

## 2017-07-03 DIAGNOSIS — N952 Postmenopausal atrophic vaginitis: Secondary | ICD-10-CM

## 2017-07-03 DIAGNOSIS — Z13820 Encounter for screening for osteoporosis: Secondary | ICD-10-CM

## 2017-07-03 DIAGNOSIS — Z01411 Encounter for gynecological examination (general) (routine) with abnormal findings: Secondary | ICD-10-CM

## 2017-07-03 DIAGNOSIS — Z780 Asymptomatic menopausal state: Secondary | ICD-10-CM | POA: Diagnosis not present

## 2017-07-03 MED ORDER — ESTRADIOL 0.1 MG/GM VA CREA: 0.2500 | TOPICAL_CREAM | VAGINAL | 5 refills | Status: DC

## 2017-07-03 NOTE — Addendum Note (Signed)
Addended by: Thurnell Garbe A on: 07/03/2017 10:24 AM   Modules accepted: Orders

## 2017-07-03 NOTE — Patient Instructions (Signed)
1. Encounter for gynecological examination with abnormal finding Gynecologic exam with atrophic vaginitis.  Pap reflex done.  Breast exam normal.  Health labs with family physician.  Schedule colonoscopy.  2. Menopause present Well on no hormone replacement therapy.  No postmenopausal bleeding.  3. Post-menopausal atrophic vaginitis Improved superficial dyspareunia with Estrace cream twice a week.  No contraindication.  Estrace a quarter of an applicator twice a week represcribed.  4. Screening for osteoporosis Vitamin D supplements, calcium rich nutrition and regular weightbearing physical activity recommended.  Will do a vitamin D level and calcium level with family physician. - DG Bone Density; Future  Other orders - estradiol (ESTRACE VAGINAL) 0.1 MG/GM vaginal cream; Place 4.40 Applicatorfuls vaginally 2 (two) times a week.  April Cowan, it was a pleasure seeing you today!  I will inform you of your results as soon as they are available.   Health Maintenance for Postmenopausal Women Menopause is a normal process in which your reproductive ability comes to an end. This process happens gradually over a span of months to years, usually between the ages of 59 and 31. Menopause is complete when you have missed 12 consecutive menstrual periods. It is important to talk with your health care provider about some of the most common conditions that affect postmenopausal women, such as heart disease, cancer, and bone loss (osteoporosis). Adopting a healthy lifestyle and getting preventive care can help to promote your health and wellness. Those actions can also lower your chances of developing some of these common conditions. What should I know about menopause? During menopause, you may experience a number of symptoms, such as:  Moderate-to-severe hot flashes.  Night sweats.  Decrease in sex drive.  Mood swings.  Headaches.  Tiredness.  Irritability.  Memory  problems.  Insomnia.  Choosing to treat or not to treat menopausal changes is an individual decision that you make with your health care provider. What should I know about hormone replacement therapy and supplements? Hormone therapy products are effective for treating symptoms that are associated with menopause, such as hot flashes and night sweats. Hormone replacement carries certain risks, especially as you become older. If you are thinking about using estrogen or estrogen with progestin treatments, discuss the benefits and risks with your health care provider. What should I know about heart disease and stroke? Heart disease, heart attack, and stroke become more likely as you age. This may be due, in part, to the hormonal changes that your body experiences during menopause. These can affect how your body processes dietary fats, triglycerides, and cholesterol. Heart attack and stroke are both medical emergencies. There are many things that you can do to help prevent heart disease and stroke:  Have your blood pressure checked at least every 1-2 years. High blood pressure causes heart disease and increases the risk of stroke.  If you are 79-59 years old, ask your health care provider if you should take aspirin to prevent a heart attack or a stroke.  Do not use any tobacco products, including cigarettes, chewing tobacco, or electronic cigarettes. If you need help quitting, ask your health care provider.  It is important to eat a healthy diet and maintain a healthy weight. ? Be sure to include plenty of vegetables, fruits, low-fat dairy products, and lean protein. ? Avoid eating foods that are high in solid fats, added sugars, or salt (sodium).  Get regular exercise. This is one of the most important things that you can do for your health. ? Try to exercise  for at least 150 minutes each week. The type of exercise that you do should increase your heart rate and make you sweat. This is known as  moderate-intensity exercise. ? Try to do strengthening exercises at least twice each week. Do these in addition to the moderate-intensity exercise.  Know your numbers.Ask your health care provider to check your cholesterol and your blood glucose. Continue to have your blood tested as directed by your health care provider.  What should I know about cancer screening? There are several types of cancer. Take the following steps to reduce your risk and to catch any cancer development as early as possible. Breast Cancer  Practice breast self-awareness. ? This means understanding how your breasts normally appear and feel. ? It also means doing regular breast self-exams. Let your health care provider know about any changes, no matter how small.  If you are 5 or older, have a clinician do a breast exam (clinical breast exam or CBE) every year. Depending on your age, family history, and medical history, it may be recommended that you also have a yearly breast X-ray (mammogram).  If you have a family history of breast cancer, talk with your health care provider about genetic screening.  If you are at high risk for breast cancer, talk with your health care provider about having an MRI and a mammogram every year.  Breast cancer (BRCA) gene test is recommended for women who have family members with BRCA-related cancers. Results of the assessment will determine the need for genetic counseling and BRCA1 and for BRCA2 testing. BRCA-related cancers include these types: ? Breast. This occurs in males or females. ? Ovarian. ? Tubal. This may also be called fallopian tube cancer. ? Cancer of the abdominal or pelvic lining (peritoneal cancer). ? Prostate. ? Pancreatic.  Cervical, Uterine, and Ovarian Cancer Your health care provider may recommend that you be screened regularly for cancer of the pelvic organs. These include your ovaries, uterus, and vagina. This screening involves a pelvic exam, which  includes checking for microscopic changes to the surface of your cervix (Pap test).  For women ages 21-65, health care providers may recommend a pelvic exam and a Pap test every three years. For women ages 54-65, they may recommend the Pap test and pelvic exam, combined with testing for human papilloma virus (HPV), every five years. Some types of HPV increase your risk of cervical cancer. Testing for HPV may also be done on women of any age who have unclear Pap test results.  Other health care providers may not recommend any screening for nonpregnant women who are considered low risk for pelvic cancer and have no symptoms. Ask your health care provider if a screening pelvic exam is right for you.  If you have had past treatment for cervical cancer or a condition that could lead to cancer, you need Pap tests and screening for cancer for at least 20 years after your treatment. If Pap tests have been discontinued for you, your risk factors (such as having a new sexual partner) need to be reassessed to determine if you should start having screenings again. Some women have medical problems that increase the chance of getting cervical cancer. In these cases, your health care provider may recommend that you have screening and Pap tests more often.  If you have a family history of uterine cancer or ovarian cancer, talk with your health care provider about genetic screening.  If you have vaginal bleeding after reaching menopause, tell your health  care provider.  There are currently no reliable tests available to screen for ovarian cancer.  Lung Cancer Lung cancer screening is recommended for adults 58-24 years old who are at high risk for lung cancer because of a history of smoking. A yearly low-dose CT scan of the lungs is recommended if you:  Currently smoke.  Have a history of at least 30 pack-years of smoking and you currently smoke or have quit within the past 15 years. A pack-year is smoking an  average of one pack of cigarettes per day for one year.  Yearly screening should:  Continue until it has been 15 years since you quit.  Stop if you develop a health problem that would prevent you from having lung cancer treatment.  Colorectal Cancer  This type of cancer can be detected and can often be prevented.  Routine colorectal cancer screening usually begins at age 70 and continues through age 59.  If you have risk factors for colon cancer, your health care provider may recommend that you be screened at an earlier age.  If you have a family history of colorectal cancer, talk with your health care provider about genetic screening.  Your health care provider may also recommend using home test kits to check for hidden blood in your stool.  A small camera at the end of a tube can be used to examine your colon directly (sigmoidoscopy or colonoscopy). This is done to check for the earliest forms of colorectal cancer.  Direct examination of the colon should be repeated every 5-10 years until age 24. However, if early forms of precancerous polyps or small growths are found or if you have a family history or genetic risk for colorectal cancer, you may need to be screened more often.  Skin Cancer  Check your skin from head to toe regularly.  Monitor any moles. Be sure to tell your health care provider: ? About any new moles or changes in moles, especially if there is a change in a mole's shape or color. ? If you have a mole that is larger than the size of a pencil eraser.  If any of your family members has a history of skin cancer, especially at a young age, talk with your health care provider about genetic screening.  Always use sunscreen. Apply sunscreen liberally and repeatedly throughout the day.  Whenever you are outside, protect yourself by wearing long sleeves, pants, a wide-brimmed hat, and sunglasses.  What should I know about osteoporosis? Osteoporosis is a condition in  which bone destruction happens more quickly than new bone creation. After menopause, you may be at an increased risk for osteoporosis. To help prevent osteoporosis or the bone fractures that can happen because of osteoporosis, the following is recommended:  If you are 13-88 years old, get at least 1,000 mg of calcium and at least 600 mg of vitamin D per day.  If you are older than age 17 but younger than age 73, get at least 1,200 mg of calcium and at least 600 mg of vitamin D per day.  If you are older than age 22, get at least 1,200 mg of calcium and at least 800 mg of vitamin D per day.  Smoking and excessive alcohol intake increase the risk of osteoporosis. Eat foods that are rich in calcium and vitamin D, and do weight-bearing exercises several times each week as directed by your health care provider. What should I know about how menopause affects my mental health? Depression may  occur at any age, but it is more common as you become older. Common symptoms of depression include:  Low or sad mood.  Changes in sleep patterns.  Changes in appetite or eating patterns.  Feeling an overall lack of motivation or enjoyment of activities that you previously enjoyed.  Frequent crying spells.  Talk with your health care provider if you think that you are experiencing depression. What should I know about immunizations? It is important that you get and maintain your immunizations. These include:  Tetanus, diphtheria, and pertussis (Tdap) booster vaccine.  Influenza every year before the flu season begins.  Pneumonia vaccine.  Shingles vaccine.  Your health care provider may also recommend other immunizations. This information is not intended to replace advice given to you by your health care provider. Make sure you discuss any questions you have with your health care provider. Document Released: 05/02/2005 Document Revised: 09/28/2015 Document Reviewed: 12/12/2014 Elsevier Interactive  Patient Education  2018 [employer].

## 2017-07-03 NOTE — Progress Notes (Signed)
April Cowan [birth year] [id number]   History:    57 y.o. G2P2L2  Married.  Daughter 37 yo OT.  Daughter 52 yo Paramedic in [place] in [place].  RP:  Established patient presenting for annual gyn exam   HPI: Menopause, well without hormone replacement therapy.  No postmenopausal bleeding.  No pelvic pain.  Normal vaginal secretions.  Superficial dyspareunia when not using the Estrace cream.  Would like a re-prescription.  Urine and bowel movements normal.  Breasts normal.  BMI 23.32.  Physically active.  Health labs with family physician.  Organizing her first screening colonoscopy.  Past medical history,surgical history, family history and social history were all reviewed and documented in the EPIC chart.  Gynecologic History Patient's last menstrual period was 04/07/2012. Contraception: post menopausal status Last Pap: 02/2015. Results were: Negative, HPV HR neg Last mammogram: 04/2017. Results were: Negative Bone Density: Will schedule here now Colonoscopy: Scheduling through her Fam MD  Obstetric History OB History  Gravida Para Term Preterm AB Living  2 2       2   SAB TAB Ectopic Multiple Live Births               # Outcome Date GA Lbr Len/2nd Weight Sex Delivery Anes PTL Lv  2 Para           1 Para              ROS: A ROS was performed and pertinent positives and negatives are included in the history.  GENERAL: No fevers or chills. HEENT: No change in vision, no earache, sore throat or sinus congestion. NECK: No pain or stiffness. CARDIOVASCULAR: No chest pain or pressure. No palpitations. PULMONARY: No shortness of breath, cough or wheeze. GASTROINTESTINAL: No abdominal pain, nausea, vomiting or diarrhea, melena or bright red blood per rectum. GENITOURINARY: No urinary frequency, urgency, hesitancy or dysuria. MUSCULOSKELETAL: No joint or muscle pain, no back pain, no recent trauma. DERMATOLOGIC: No rash, no itching, no lesions. ENDOCRINE: No polyuria, polydipsia, no heat or cold  intolerance. No recent change in weight. HEMATOLOGICAL: No anemia or easy bruising or bleeding. NEUROLOGIC: No headache, seizures, numbness, tingling or weakness. PSYCHIATRIC: No depression, no loss of interest in normal activity or change in sleep pattern.     Exam:   BP 102/68   Ht 5' 7.25" (1.708 m)   Wt 150 lb (68 kg)   LMP 04/07/2012   BMI 23.32 kg/m   Body mass index is 23.32 kg/m.  General appearance : Well developed well nourished female. No acute distress HEENT: Eyes: no retinal hemorrhage or exudates,  Neck supple, trachea midline, no carotid bruits, no thyroidmegaly Lungs: Clear to auscultation, no rhonchi or wheezes, or rib retractions  Heart: Regular rate and rhythm, no murmurs or gallops Breast:Examined in sitting and supine position were symmetrical in appearance, no palpable masses or tenderness,  no skin retraction, no nipple inversion, no nipple discharge, no skin discoloration, no axillary or supraclavicular lymphadenopathy Abdomen: no palpable masses or tenderness, no rebound or guarding Extremities: no edema or skin discoloration or tenderness  Pelvic: Vulva: Normal             Vagina: No gross lesions or discharge  Cervix: No gross lesions or discharge.  Pap reflex done.  Uterus  AV, normal size, shape and consistency, non-tender and mobile  Adnexa  Without masses or tenderness  Anus: Normal   Assessment/Plan:  57 y.o. female for annual exam   1. Encounter for gynecological  examination with abnormal finding Gynecologic exam with atrophic vaginitis.  Pap reflex done.  Breast exam normal.  Health labs with family physician.  Schedule colonoscopy.  2. Menopause present Well on no hormone replacement therapy.  No postmenopausal bleeding.  3. Post-menopausal atrophic vaginitis Improved superficial dyspareunia with Estrace cream twice a week.  No contraindication.  Estrace a quarter of an applicator twice a week represcribed.  4. Screening for  osteoporosis Vitamin D supplements, calcium rich nutrition and regular weightbearing physical activity recommended.  Will do a vitamin D level and calcium level with family physician. - DG Bone Density; Future  Other orders - estradiol (ESTRACE VAGINAL) 0.1 MG/GM vaginal cream; Place 4.19 Applicatorfuls vaginally 2 (two) times a week.  [name] MD, 8:56 AM 07/03/2017

## 2017-07-06 LAB — PAP IG W/ RFLX HPV ASCU

## 2017-07-13 ENCOUNTER — Other Ambulatory Visit: Payer: Self-pay | Admitting: Gynecology

## 2017-07-13 DIAGNOSIS — Z13820 Encounter for screening for osteoporosis: Secondary | ICD-10-CM

## 2017-09-10 IMAGING — MG MM DIGITAL DIAGNOSTIC BILAT W/ TOMO W/ CAD
8 of 15 series · 8 of 35 positions shown · non-contrast
Comparison: Previous exam(s).

CLINICAL DATA: Patient presents with a palpable abnormality along
the inframammary region of the left breast.

EXAM:
2D DIGITAL DIAGNOSTIC BILATERAL MAMMOGRAM WITH CAD AND ADJUNCT TOMO
ULTRASOUND LEFT BREAST

[R MLO]
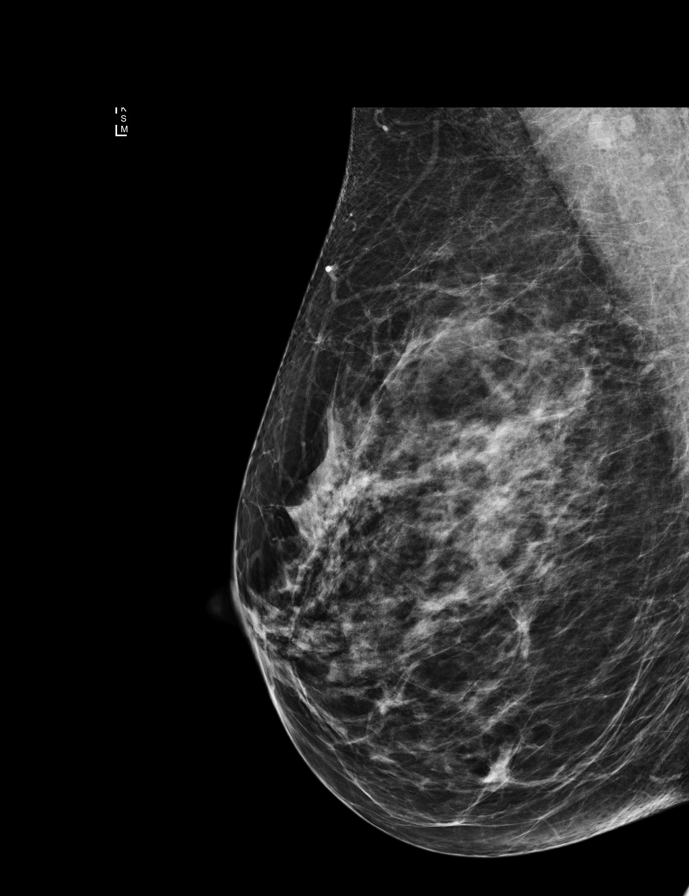

[L MLO synth-2D (1 of 2)]
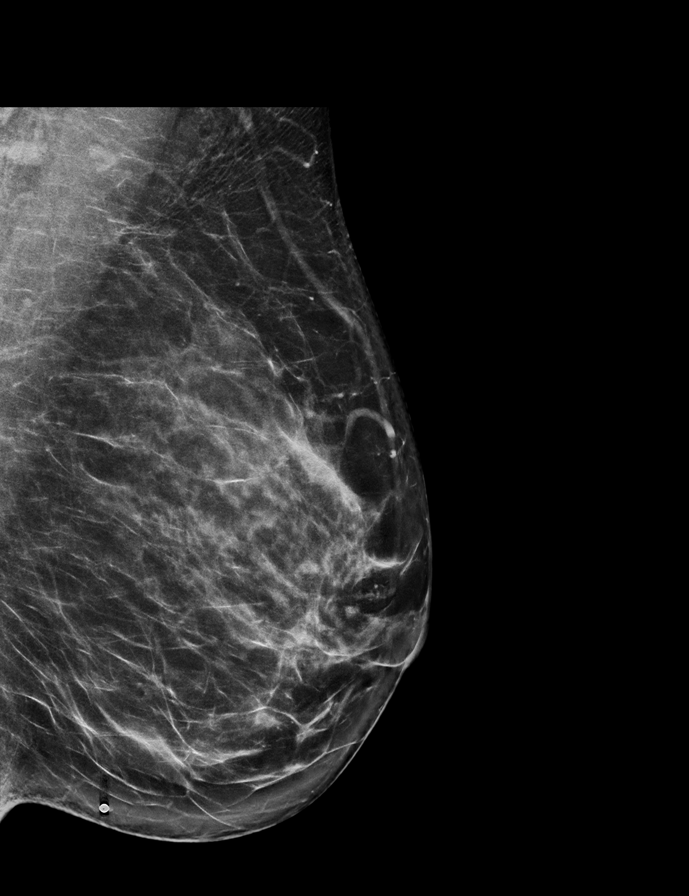

[L MLO]
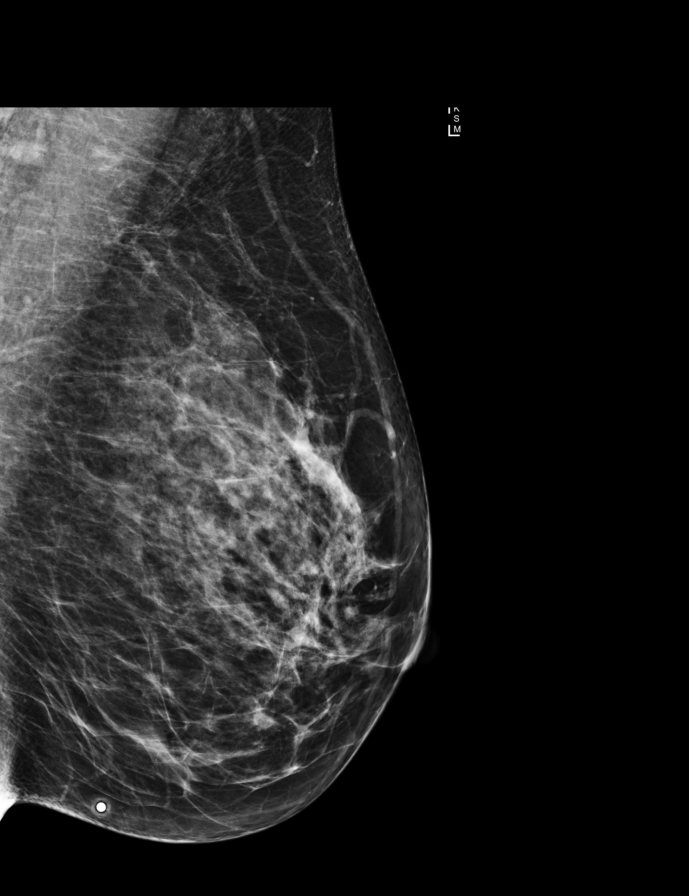

[R CC synth-2D]
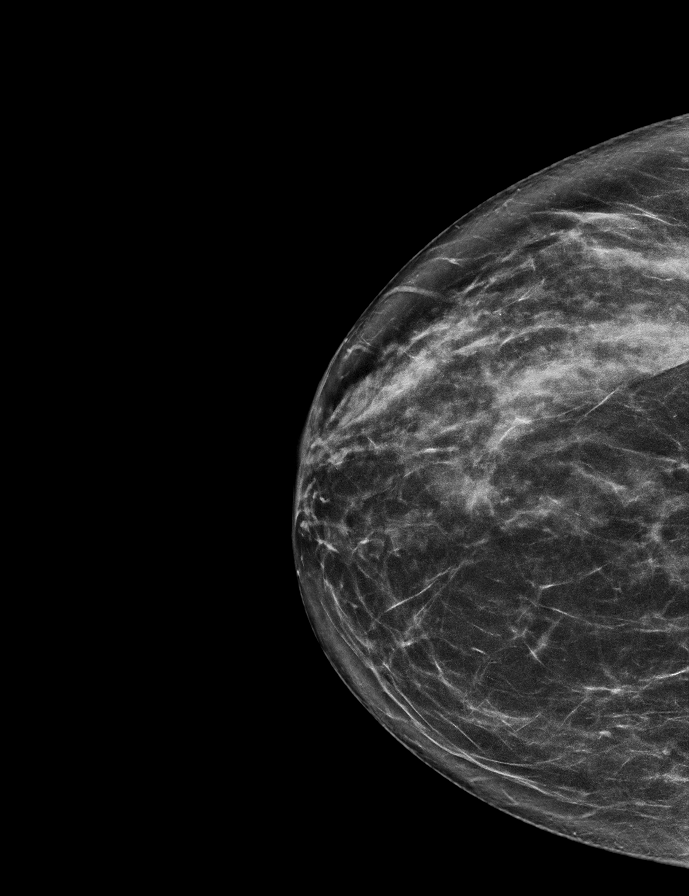

[L CC]
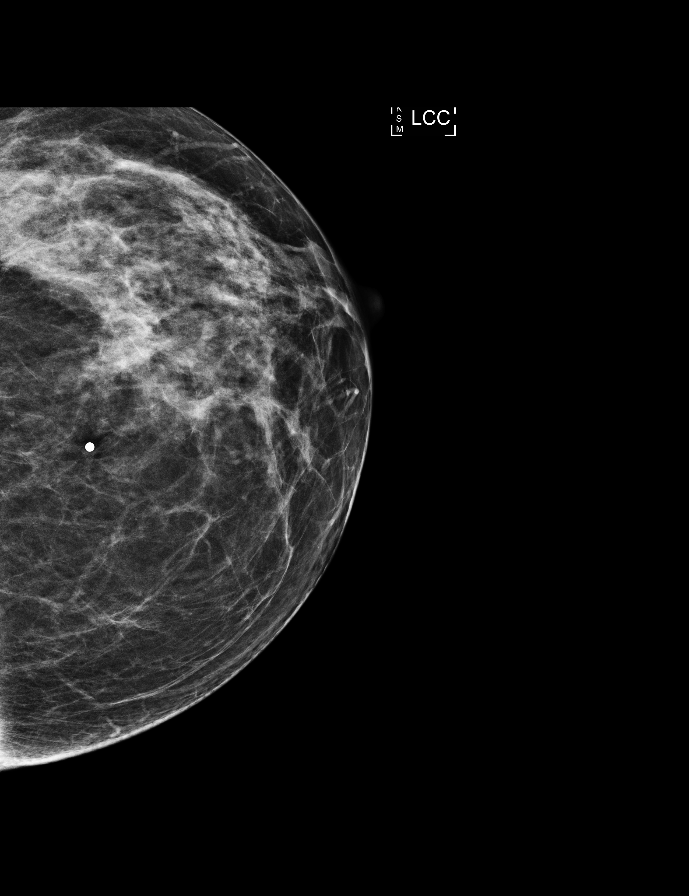

[R CC]
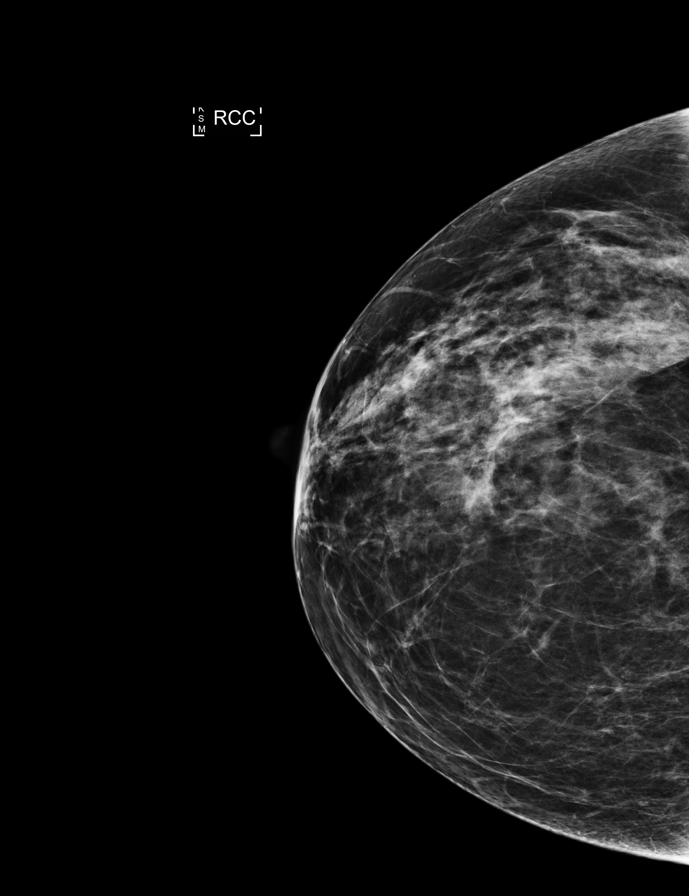

[L MLO synth-2D (2 of 2)]
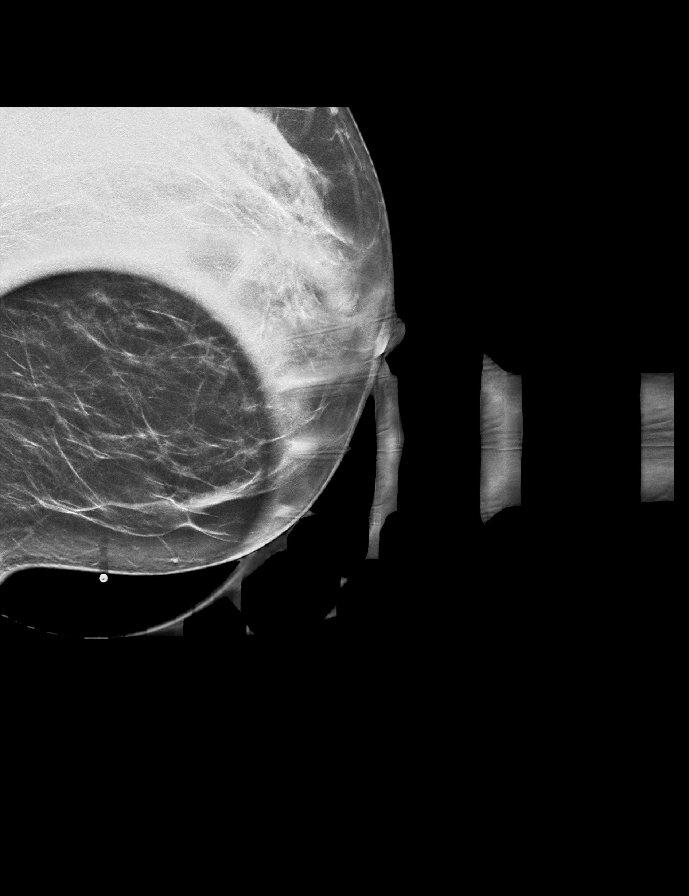

[R MLO synth-2D]
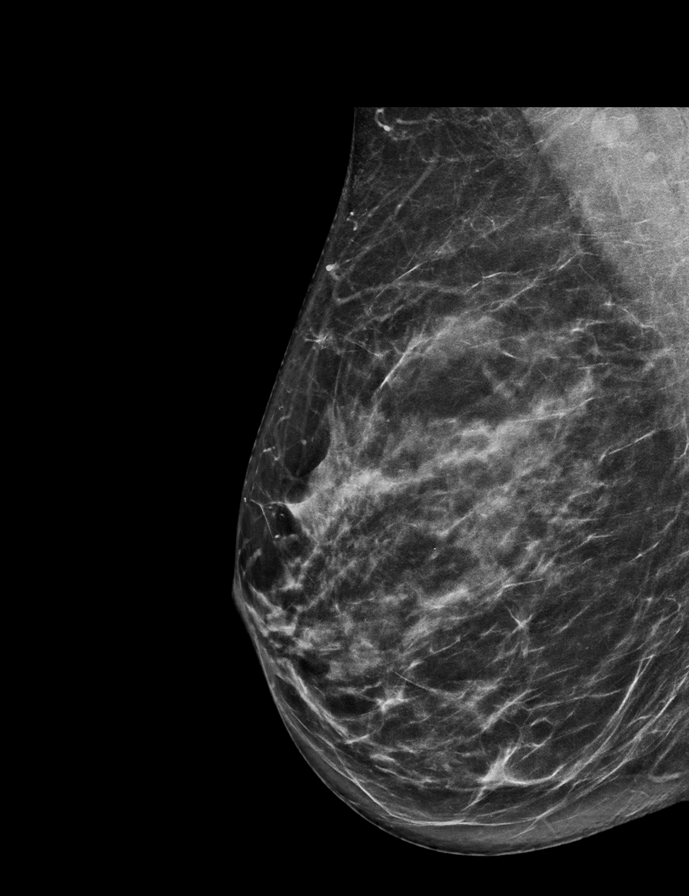

[8 of 35 positions shown; findings below may reference images not displayed]

ACR Breast Density Category c: The breast tissue is heterogeneously
dense, which may obscure small masses.
FINDINGS: There are no discrete masses, areas of architectural distortion,
areas of significant asymmetry or suspicious calcifications. No
mammographic change from prior studies.

Mammographic images were processed with CAD.

On physical exam, there is a line of relatively firm superficial
nodules adjacent to the inframammary fold, medial to midline.

Targeted ultrasound is performed, showing normal fibroglandular and
fibrofatty tissue along the inferior medial aspect of the left
breast adjacent to the inframammary fold. No mass or cyst. No
correlate to the nodularity noted on exam.
IMPRESSION: 1. No evidence malignancy.

RECOMMENDATION:
Screening mammogram in one year.(Code:[id number]-[id number])

I have discussed the findings and recommendations with the patient.
Results were also provided in writing at the conclusion of the
visit. If applicable, a reminder letter will be sent to the patient
regarding the next appointment.

BI-RADS CATEGORY  1: Negative.

## 2018-03-10 ENCOUNTER — Encounter: Payer: Self-pay | Admitting: Family Medicine

## 2018-03-26 ENCOUNTER — Ambulatory Visit (INDEPENDENT_AMBULATORY_CARE_PROVIDER_SITE_OTHER): Payer: Managed Care, Other (non HMO) | Admitting: Obstetrics & Gynecology

## 2018-03-26 ENCOUNTER — Encounter: Payer: Self-pay | Admitting: Obstetrics & Gynecology

## 2018-03-26 VITALS — BP 118/76

## 2018-03-26 DIAGNOSIS — N649 Disorder of breast, unspecified: Secondary | ICD-10-CM

## 2018-03-26 DIAGNOSIS — L739 Follicular disorder, unspecified: Secondary | ICD-10-CM | POA: Diagnosis not present

## 2018-03-26 DIAGNOSIS — L988 Other specified disorders of the skin and subcutaneous tissue: Secondary | ICD-10-CM

## 2018-03-26 NOTE — Progress Notes (Signed)
    April Cowan Aug 17, 1960 [id number]        58 y.o.  G2P2   RP: Right axillary lump x 1 week and Left breast change in skin lesion x about 2 months  HPI: Patient felt a little tender bump superficially at the Rt axilla while taking her shower about a week ago.  Since then, the bump has decreased in size, not feeling it today.  Left breast slightly raised skin lesion with mild change in color in the past 2 months.  No breast lump.  No nipple discharge.  Paternal GM with Breast Cancer.  Last Screening Mammo negative 04/2017.   OB History  Gravida Para Term Preterm AB Living  2 2       2   SAB TAB Ectopic Multiple Live Births               # Outcome Date GA Lbr Len/2nd Weight Sex Delivery Anes PTL Lv  2 Para           1 Para             Past medical history,surgical history, problem list, medications, allergies, family history and social history were all reviewed and documented in the EPIC chart.   Directed ROS with pertinent positives and negatives documented in the history of present illness/assessment and plan.  Exam:  Vitals:   03/26/18 1104  BP: 118/76   General appearance:  Normal  Breast exam:  Left breast and Lt axilla normal.  1 cm x 1 cm slightly raised pale brown skin lesion on upper outer left breast.                         Right breast normal.  Rt axilla normal, no nodule/mass felt.  No skin lesion, no sebaceous cyst seen or felt.  NT.   Assessment/Plan:  58 y.o. G2P2   1. Skin lesion of breast Slightly raised, pale brown 1 cm skin lesion on Lt upper outer breast.  Schedule Dermato appointment to r/o Basal cell Ca or other significant skin lesion.  2. Folliculitis of right axilla Resolved.  Normal axilla on exam today.  Bilateral breasts and axillae normal on exam.  Reassured.  Will schedule screening mammo 04/2018.  Counseling on above issues and coordination of care >50% x 15 minutes.  [name] MD, 11:23 AM 03/26/2018

## 2018-03-26 NOTE — Patient Instructions (Signed)
1. Skin lesion of breast Slightly raised, pale brown 1 cm skin lesion on Lt upper outer breast.  Schedule Dermato appointment to r/o Basal cell Ca or other significant skin lesion.  2. Folliculitis of right axilla Resolved.  Normal axilla on exam today.  Bilateral breasts and axillae normal on exam.  Reassured.  Will schedule screening mammo 04/2018.  Tavi, it was a pleasure seeing you today!

## 2018-06-23 ENCOUNTER — Telehealth: Payer: Self-pay | Admitting: *Deleted

## 2018-06-23 NOTE — Telephone Encounter (Signed)
LM to set up webex TOC.

## 2018-06-23 NOTE — Telephone Encounter (Signed)
Copied from [place] [id number]. Topic: Appointment Scheduling - Transfer of Care >> Jun 22, 2018  2:28 PM [name] wrote: Pt is requesting to transfer FROM: April Cowan- pt rec'd the letter - pt left vm on the general mailbox  Pt is requesting to transfer TO: [name]  Reason for requested transfer: pt rec letter in the mail and wanted to make an appt to transfer   Send CRM to patient's current PCP (transferring FROM).

## 2018-07-01 NOTE — Telephone Encounter (Signed)
Left message for patient to call back  

## 2018-07-05 ENCOUNTER — Other Ambulatory Visit: Payer: Self-pay | Admitting: *Deleted

## 2018-07-05 MED ORDER — ESTRADIOL 0.1 MG/GM VA CREA: 0.2500 | TOPICAL_CREAM | VAGINAL | 0 refills | Status: DC

## 2018-07-05 NOTE — Telephone Encounter (Signed)
Annual exam scheduled on 07/19/18.

## 2018-07-08 NOTE — Telephone Encounter (Signed)
Left message for patient to call back  

## 2018-07-16 ENCOUNTER — Encounter: Payer: Managed Care, Other (non HMO) | Admitting: Obstetrics & Gynecology

## 2018-07-19 ENCOUNTER — Encounter: Payer: Managed Care, Other (non HMO) | Admitting: Obstetrics & Gynecology

## 2018-12-21 ENCOUNTER — Encounter: Payer: Self-pay | Admitting: Gynecology

## 2019-04-04 ENCOUNTER — Ambulatory Visit: Payer: Managed Care, Other (non HMO) | Attending: Internal Medicine

## 2019-04-04 DIAGNOSIS — Z23 Encounter for immunization: Secondary | ICD-10-CM

## 2019-04-04 NOTE — Progress Notes (Signed)
   Covid-19 Vaccination Clinic  Name:  April Cowan    MRN: [id number] DOB: 1960/06/17  04/04/2019  Ms. April Cowan was observed post Covid-19 immunization for 30 minutes based on pre-vaccination screening without incidence. She was provided with Vaccine Information Sheet and instruction to access the V-Safe system.   Ms. April Cowan was instructed to call 911 with any severe reactions post vaccine: Marland Kitchen Difficulty breathing  . Swelling of your face and throat  . A fast heartbeat  . A bad rash all over your body  . Dizziness and weakness

## 2019-04-17 ENCOUNTER — Telehealth: Payer: Self-pay

## 2019-04-17 NOTE — Telephone Encounter (Signed)
Patient returned call and says her appointment on 04/23/19 at 0930 is ok.

## 2019-04-22 ENCOUNTER — Ambulatory Visit: Payer: Managed Care, Other (non HMO)

## 2019-04-23 ENCOUNTER — Ambulatory Visit: Payer: Managed Care, Other (non HMO) | Attending: Internal Medicine

## 2019-04-23 DIAGNOSIS — Z23 Encounter for immunization: Secondary | ICD-10-CM | POA: Insufficient documentation

## 2019-04-23 NOTE — Progress Notes (Signed)
   Covid-19 Vaccination Clinic  Name:  April Cowan    MRN: [id number] DOB: 1960/11/29  04/23/2019  April Cowan was observed post Covid-19 immunization for 15 minutes without incidence. She was provided with Vaccine Information Sheet and instruction to access the V-Safe system.   April Cowan was instructed to call 911 with any severe reactions post vaccine: Marland Kitchen Difficulty breathing  . Swelling of your face and throat  . A fast heartbeat  . A bad rash all over your body  . Dizziness and weakness    Immunizations Administered    Name Date Dose VIS Date Route   Pfizer COVID-19 Vaccine 04/23/2019  9:26 AM 0.3 mL 03/04/2019 Intramuscular   Manufacturer: [place]   Lot: [id number]   [place]: [id number]

## 2019-10-13 ENCOUNTER — Other Ambulatory Visit: Payer: Self-pay | Admitting: Obstetrics & Gynecology

## 2019-10-13 DIAGNOSIS — Z1231 Encounter for screening mammogram for malignant neoplasm of breast: Secondary | ICD-10-CM

## 2019-10-14 ENCOUNTER — Other Ambulatory Visit: Payer: Self-pay

## 2019-10-14 ENCOUNTER — Ambulatory Visit
Admission: RE | Admit: 2019-10-14 | Discharge: 2019-10-14 | Disposition: A | Discharge status: Home / Self Care | Payer: Managed Care, Other (non HMO) | Source: Ambulatory Visit

## 2019-10-14 DIAGNOSIS — Z1231 Encounter for screening mammogram for malignant neoplasm of breast: Secondary | ICD-10-CM

## 2019-11-07 ENCOUNTER — Other Ambulatory Visit: Payer: Self-pay | Admitting: *Deleted

## 2019-11-07 DIAGNOSIS — Z13820 Encounter for screening for osteoporosis: Secondary | ICD-10-CM

## 2019-11-29 ENCOUNTER — Ambulatory Visit (INDEPENDENT_AMBULATORY_CARE_PROVIDER_SITE_OTHER): Payer: Managed Care, Other (non HMO)

## 2019-11-29 ENCOUNTER — Other Ambulatory Visit: Payer: Self-pay

## 2019-11-29 ENCOUNTER — Other Ambulatory Visit: Payer: Self-pay | Admitting: Obstetrics & Gynecology

## 2019-11-29 DIAGNOSIS — M8589 Other specified disorders of bone density and structure, multiple sites: Secondary | ICD-10-CM | POA: Diagnosis not present

## 2019-11-29 DIAGNOSIS — Z780 Asymptomatic menopausal state: Secondary | ICD-10-CM | POA: Diagnosis not present

## 2019-11-29 DIAGNOSIS — Z13820 Encounter for screening for osteoporosis: Secondary | ICD-10-CM

## 2019-12-26 ENCOUNTER — Encounter: Payer: Managed Care, Other (non HMO) | Admitting: Obstetrics & Gynecology

## 2019-12-29 ENCOUNTER — Ambulatory Visit (INDEPENDENT_AMBULATORY_CARE_PROVIDER_SITE_OTHER): Payer: Managed Care, Other (non HMO) | Admitting: Obstetrics & Gynecology

## 2019-12-29 ENCOUNTER — Encounter: Payer: Self-pay | Admitting: Obstetrics & Gynecology

## 2019-12-29 ENCOUNTER — Other Ambulatory Visit: Payer: Self-pay

## 2019-12-29 VITALS — BP 132/80 | Ht 67.0 in | Wt 171.0 lb

## 2019-12-29 DIAGNOSIS — Z01419 Encounter for gynecological examination (general) (routine) without abnormal findings: Secondary | ICD-10-CM

## 2019-12-29 DIAGNOSIS — M8589 Other specified disorders of bone density and structure, multiple sites: Secondary | ICD-10-CM

## 2019-12-29 DIAGNOSIS — N952 Postmenopausal atrophic vaginitis: Secondary | ICD-10-CM

## 2019-12-29 DIAGNOSIS — Z780 Asymptomatic menopausal state: Secondary | ICD-10-CM | POA: Diagnosis not present

## 2019-12-29 NOTE — Progress Notes (Signed)
April Cowan [birth year] [id number]   History:    59 y.o. G2P2L2  Married.  Daughter 44 yo OT.  Daughter 82 yo Paramedic in [place] in [place].  RP:  Established patient presenting for annual gyn exam   HPI: Menopause, well without hormone replacement therapy.  No postmenopausal bleeding.  No pelvic pain.  Normal vaginal secretions.  Superficial dyspareunia, would like to start back on Estradiol cream.  Urine and bowel movements normal.  Breasts normal.  BMI 26.78.  Physically active.  Health labs with family physician.  Organizing her first screening colonoscopy.  Past medical history,surgical history, family history and social history were all reviewed and documented in the EPIC chart.  Gynecologic History Patient's last menstrual period was 04/07/2012.  Obstetric History OB History  Gravida Para Term Preterm AB Living  2 2       2   SAB TAB Ectopic Multiple Live Births               # Outcome Date GA Lbr Len/2nd Weight Sex Delivery Anes PTL Lv  2 Para           1 Para              ROS: A ROS was performed and pertinent positives and negatives are included in the history.  GENERAL: No fevers or chills. HEENT: No change in vision, no earache, sore throat or sinus congestion. NECK: No pain or stiffness. CARDIOVASCULAR: No chest pain or pressure. No palpitations. PULMONARY: No shortness of breath, cough or wheeze. GASTROINTESTINAL: No abdominal pain, nausea, vomiting or diarrhea, melena or bright red blood per rectum. GENITOURINARY: No urinary frequency, urgency, hesitancy or dysuria. MUSCULOSKELETAL: No joint or muscle pain, no back pain, no recent trauma. DERMATOLOGIC: No rash, no itching, no lesions. ENDOCRINE: No polyuria, polydipsia, no heat or cold intolerance. No recent change in weight. HEMATOLOGICAL: No anemia or easy bruising or bleeding. NEUROLOGIC: No headache, seizures, numbness, tingling or weakness. PSYCHIATRIC: No depression, no loss of interest in normal activity or change  in sleep pattern.     Exam:   BP 132/80 (BP Location: Right Arm, Patient Position: Sitting, Cuff Size: Normal)   Ht 5\' 7"  (1.702 m)   Wt 171 lb (77.6 kg)   LMP 04/07/2012   BMI 26.78 kg/m   Body mass index is 26.78 kg/m.  General appearance : Well developed well nourished female. No acute distress HEENT: Eyes: no retinal hemorrhage or exudates,  Neck supple, trachea midline, no carotid bruits, no thyroidmegaly Lungs: Clear to auscultation, no rhonchi or wheezes, or rib retractions  Heart: Regular rate and rhythm, no murmurs or gallops Breast:Examined in sitting and supine position were symmetrical in appearance, no palpable masses or tenderness,  no skin retraction, no nipple inversion, no nipple discharge, no skin discoloration, no axillary or supraclavicular lymphadenopathy Abdomen: no palpable masses or tenderness, no rebound or guarding Extremities: no edema or skin discoloration or tenderness  Pelvic: Vulva: Normal             Vagina: No gross lesions or discharge  Cervix: No gross lesions or discharge.  Pap reflex done.  Uterus  AV, normal size, shape and consistency, non-tender and mobile  Adnexa  Without masses or tenderness  Anus: Normal   Assessment/Plan:  59 y.o. female for annual exam   1. Encounter for routine gynecological examination with Papanicolaou smear of cervix Normal gynecologic exam in menopause.  Pap reflex done.  Breast exam normal.  Screening mammogram July  2021 was negative.  Will schedule first colonoscopy now.  Health labs with family physician.  Body mass index 26.78.  Recommend a slightly lower calorie/carb diet.  Aerobic activities 5 times a week and light weightlifting every 2 days.  2. Postmenopause Well on no hormone replacement therapy.  No postmenopausal bleeding.  3. Postmenopausal atrophic vaginitis Complains of vaginal dryness and superficial dyspareunia.  Will restart on estradiol cream.  No contraindication.  Recommended dosage of a  quarter applicator twice a week.  Decision to prescribe compound estradiol cream.  4. Osteopenia of multiple sites Bone density September 2021 showed osteopenia.  We will do a vitamin D level with her family physician.  Vitamin D supplements according to results, calcium intake of a total of 1500 mg daily including nutritional and supplements.  Recommend regular weightbearing physical activities.  We will repeat a bone density in 2 years.  [name] MD, 12:42 PM 12/29/2019

## 2019-12-29 NOTE — Addendum Note (Signed)
Addended by: Lorine Bears on: 12/29/2019 02:27 PM   Modules accepted: Orders

## 2019-12-30 ENCOUNTER — Telehealth: Payer: Self-pay | Admitting: *Deleted

## 2019-12-30 MED ORDER — NONFORMULARY OR COMPOUNDED ITEM: 4 refills | Status: DC

## 2019-12-30 NOTE — Telephone Encounter (Signed)
Rx called in 

## 2019-12-30 NOTE — Telephone Encounter (Signed)
-----   Message from [name], MD sent at 12/29/2019  1:00 PM EDT ----- Regarding: Compound Estradiol cream at [facility] Please send a prescription of Compound Estradiol cream 1/4 applicator twice a week x 1 year.

## 2020-01-05 LAB — HUMAN PAPILLOMAVIRUS, HIGH RISK: HPV DNA High Risk: NOT DETECTED

## 2020-01-05 LAB — PAP IG W/ RFLX HPV ASCU

## 2020-01-06 MED ORDER — NONFORMULARY OR COMPOUNDED ITEM: 4 refills | Status: DC

## 2020-01-06 NOTE — Addendum Note (Signed)
Addended by: Thamas Jaegers on: 01/06/2020 11:59 AM   Modules accepted: Orders

## 2020-01-06 NOTE — Telephone Encounter (Signed)
Patient called back stating Rx was not at pharmacy , Rx called in again.

## 2020-04-05 ENCOUNTER — Other Ambulatory Visit: Payer: Self-pay

## 2020-04-05 ENCOUNTER — Encounter: Payer: Self-pay | Admitting: Internal Medicine

## 2020-04-05 ENCOUNTER — Ambulatory Visit (INDEPENDENT_AMBULATORY_CARE_PROVIDER_SITE_OTHER): Payer: 59 | Admitting: Internal Medicine

## 2020-04-05 VITALS — BP 120/84 | HR 63 | Temp 98.0°F | Ht 67.5 in | Wt 166.8 lb

## 2020-04-05 DIAGNOSIS — Z23 Encounter for immunization: Secondary | ICD-10-CM | POA: Diagnosis not present

## 2020-04-05 DIAGNOSIS — Z1211 Encounter for screening for malignant neoplasm of colon: Secondary | ICD-10-CM | POA: Diagnosis not present

## 2020-04-05 NOTE — Progress Notes (Signed)
New Patient Office Visit     This visit occurred during the SARS-CoV-2 public health emergency.  Safety protocols were in place, including screening questions prior to the visit, additional usage of staff PPE, and extensive cleaning of exam room while observing appropriate contact time as indicated for disinfecting solutions.    CC/Reason for Visit: Establish care, discuss chronic conditions Previous PCP: Dr. Maudie Mercury Last Visit: 2018  HPI: April Cowan is a 60 y.o. female who is coming in today for the above mentioned reasons.  She has no past medical history of significance.  The only medication she takes is estradiol cream for vaginal dryness.  She is married, has 2 adult daughters.  She retired about a year ago from many years in the [employer].  She exercises 5 days a week, she does not smoke, she drinks alcohol only socially.  She has allergies to sulfa drugs which cause hives.  Family history significant for mother with osteoarthritis and hypothyroidism.  Her brother also has hypothyroidism.  She is requesting her first shingles vaccine today.  She has all 3 COVID vaccines.  She is overdue for screening colonoscopy.  She follows with GYN routinely and had her Pap smear in October 2021 and her mammogram in July 2020.  She has no acute complaints today.   Past Medical/Surgical History: Past Medical History:  Diagnosis Date  . Spider veins   . Vaginal atrophy     No past surgical history on file.  Social History:  reports that she has never smoked. She has never used smokeless tobacco. She reports current alcohol use. She reports that she does not use drugs.  Allergies: Allergies  Allergen Reactions  . Sulfa Antibiotics     Family History:  Family History  Problem Relation Age of Onset  . Arthritis Paternal Grandmother   . Colon cancer Paternal Grandmother   . Breast cancer Paternal Grandmother   . Lung cancer Paternal Grandfather   . Hyperlipidemia Mother   .  Heart disease Father   . Hypertension Father   . Atrial fibrillation Father      Current Outpatient Medications:  .  NONFORMULARY OR COMPOUNDED ITEM, Estradiol vaginal cream 0.02% insert 1 gram applicator twice weekly, Disp: 90 each, Rfl: 4  Review of Systems:  Constitutional: Denies fever, chills, diaphoresis, appetite change and fatigue.  HEENT: Denies photophobia, eye pain, redness, hearing loss, ear pain, congestion, sore throat, rhinorrhea, sneezing, mouth sores, trouble swallowing, neck pain, neck stiffness and tinnitus.   Respiratory: Denies SOB, DOE, cough, chest tightness,  and wheezing.   Cardiovascular: Denies chest pain, palpitations and leg swelling.  Gastrointestinal: Denies nausea, vomiting, abdominal pain, diarrhea, constipation, blood in stool and abdominal distention.  Genitourinary: Denies dysuria, urgency, frequency, hematuria, flank pain and difficulty urinating.  Endocrine: Denies: hot or cold intolerance, sweats, changes in hair or nails, polyuria, polydipsia. Musculoskeletal: Denies myalgias, back pain, joint swelling, arthralgias and gait problem.  Skin: Denies pallor, rash and wound.  Neurological: Denies dizziness, seizures, syncope, weakness, light-headedness, numbness and headaches.  Hematological: Denies adenopathy. Easy bruising, personal or family bleeding history  Psychiatric/Behavioral: Denies suicidal ideation, mood changes, confusion, nervousness, sleep disturbance and agitation    Physical Exam: Vitals:   04/05/20 1513  BP: 120/84  Pulse: 63  Temp: 98 F (36.7 C)  TempSrc: Oral  SpO2: 99%  Weight: 166 lb 12.8 oz (75.7 kg)  Height: 5' 7.5" (1.715 m)   Body mass index is 25.74 kg/m.  Constitutional: NAD, calm,  comfortable Eyes: PERRL, lids and conjunctivae normal ENMT: Mucous membranes are moist.  Respiratory: clear to auscultation bilaterally, no wheezing, no crackles. Normal respiratory effort. No accessory muscle use.  Cardiovascular:  Regular rate and rhythm, no murmurs / rubs / gallops. No extremity edema.  Neurologic: Grossly intact and nonfocal Psychiatric: Normal judgment and insight. Alert and oriented x 3. Normal mood.    Impression and Plan:  Screening for malignant neoplasm of colon  - Plan: Ambulatory referral to Gastroenterology  Need for shingles vaccine -For shingles vaccine administered today.    Patient Instructions  -Nice seeing you today!!  -First shingles vaccine today.  -Schedule follow up for your physical. Please come in fasting that day.     Lelon Frohlich, MD [facility]

## 2020-04-05 NOTE — Patient Instructions (Signed)
-  Nice seeing you today!!  -First shingles vaccine today.  -Schedule follow up for your physical. Please come in fasting that day.

## 2020-04-05 NOTE — Addendum Note (Signed)
Addended by: Westley Hummer B on: 04/05/2020 05:34 PM   Modules accepted: Orders

## 2020-06-05 ENCOUNTER — Encounter: Payer: 59 | Admitting: Internal Medicine

## 2020-06-06 ENCOUNTER — Telehealth: Payer: Self-pay | Admitting: *Deleted

## 2020-06-06 NOTE — Telephone Encounter (Signed)
Attempted virtual pre visit, no answer, left voicemail asking patient to call back before 4:45 today or reschedule for another day to avoid cancellation of up-coming colonoscopy 06/20/2020

## 2020-06-06 NOTE — Telephone Encounter (Signed)
Patient called back and rescheduled for 07/05/20 and 07/23/20.

## 2020-06-19 ENCOUNTER — Encounter: Payer: 59 | Admitting: Internal Medicine

## 2020-06-20 ENCOUNTER — Encounter: Payer: 59 | Admitting: Gastroenterology

## 2020-06-22 ENCOUNTER — Ambulatory Visit (INDEPENDENT_AMBULATORY_CARE_PROVIDER_SITE_OTHER): Payer: 59 | Admitting: Internal Medicine

## 2020-06-22 ENCOUNTER — Telehealth: Payer: Self-pay | Admitting: Internal Medicine

## 2020-06-22 ENCOUNTER — Other Ambulatory Visit: Payer: Self-pay | Admitting: Internal Medicine

## 2020-06-22 ENCOUNTER — Encounter: Payer: Self-pay | Admitting: Internal Medicine

## 2020-06-22 ENCOUNTER — Other Ambulatory Visit: Payer: Self-pay

## 2020-06-22 VITALS — BP 110/70 | HR 68 | Temp 98.0°F | Ht 68.0 in | Wt 161.8 lb

## 2020-06-22 DIAGNOSIS — E559 Vitamin D deficiency, unspecified: Secondary | ICD-10-CM

## 2020-06-22 DIAGNOSIS — E785 Hyperlipidemia, unspecified: Secondary | ICD-10-CM

## 2020-06-22 DIAGNOSIS — Z23 Encounter for immunization: Secondary | ICD-10-CM

## 2020-06-22 DIAGNOSIS — Z0000 Encounter for general adult medical examination without abnormal findings: Secondary | ICD-10-CM

## 2020-06-22 DIAGNOSIS — E538 Deficiency of other specified B group vitamins: Secondary | ICD-10-CM | POA: Insufficient documentation

## 2020-06-22 LAB — COMPREHENSIVE METABOLIC PANEL
ALT: 12 U/L (ref 0–35)
AST: 15 U/L (ref 0–37)
Albumin: 4.4 g/dL (ref 3.5–5.2)
Alkaline Phosphatase: 60 U/L (ref 39–117)
BUN: 20 mg/dL (ref 6–23)
CO2: 27 mEq/L (ref 19–32)
Calcium: 9.3 mg/dL (ref 8.4–10.5)
Chloride: 103 mEq/L (ref 96–112)
Creatinine, Ser: 0.77 mg/dL (ref 0.40–1.20)
GFR: 82.85 mL/min (ref >60.00)
Glucose, Bld: 90 mg/dL (ref 70–99)
Potassium: 4.4 mEq/L (ref 3.5–5.1)
Sodium: 140 mEq/L (ref 135–145)
Total Bilirubin: 0.5 mg/dL (ref 0.2–1.2)
Total Protein: 7.3 g/dL (ref 6.0–8.3)

## 2020-06-22 LAB — CBC WITH DIFFERENTIAL/PLATELET
Basophils Absolute: 0.1 10*3/uL (ref 0.0–0.1)
Basophils Relative: 1.2 % (ref 0.0–3.0)
Eosinophils Absolute: 0.1 10*3/uL (ref 0.0–0.7)
Eosinophils Relative: 3.4 % (ref 0.0–5.0)
HCT: 39.4 % (ref 36.0–46.0)
Hemoglobin: 13.5 g/dL (ref 12.0–15.0)
Lymphocytes Relative: 23.6 % (ref 12.0–46.0)
Lymphs Abs: 1 10*3/uL (ref 0.7–4.0)
MCHC: 34.2 g/dL (ref 30.0–36.0)
MCV: 93.5 fl (ref 78.0–100.0)
Monocytes Absolute: 0.4 10*3/uL (ref 0.1–1.0)
Monocytes Relative: 10.3 % (ref 3.0–12.0)
Neutro Abs: 2.6 10*3/uL (ref 1.4–7.7)
Neutrophils Relative %: 61.5 % (ref 43.0–77.0)
Platelets: 176 10*3/uL (ref 150.0–400.0)
RBC: 4.22 Mil/uL (ref 3.87–5.11)
RDW: 12.9 % (ref 11.5–15.5)
WBC: 4.2 10*3/uL (ref 4.0–10.5)

## 2020-06-22 LAB — LIPID PANEL
Cholesterol: 237 mg/dL — ABNORMAL HIGH (ref 0–200)
HDL: 78.2 mg/dL (ref >39.00)
LDL Cholesterol: 146 mg/dL — ABNORMAL HIGH (ref 0–99)
NonHDL: 158.76
Total CHOL/HDL Ratio: 3
Triglycerides: 63 mg/dL (ref 0.0–149.0)
VLDL: 12.6 mg/dL (ref 0.0–40.0)

## 2020-06-22 LAB — TSH: TSH: 2.5 u[IU]/mL (ref 0.35–4.50)

## 2020-06-22 LAB — HEMOGLOBIN A1C: Hgb A1c MFr Bld: 5.2 % (ref 4.6–6.5)

## 2020-06-22 LAB — VITAMIN D 25 HYDROXY (VIT D DEFICIENCY, FRACTURES): VITD: 16.17 ng/mL — ABNORMAL LOW (ref 30.00–100.00)

## 2020-06-22 LAB — VITAMIN B12: Vitamin B-12: 150 pg/mL — ABNORMAL LOW (ref 211–911)

## 2020-06-22 MED ORDER — ATORVASTATIN CALCIUM 20 MG PO TABS: 20.0000 mg | ORAL_TABLET | Freq: Every day | ORAL | 1 refills | Status: DC

## 2020-06-22 MED ORDER — VITAMIN D (ERGOCALCIFEROL) 1.25 MG (50000 UNIT) PO CAPS: 50000.0000 [IU] | ORAL_CAPSULE | ORAL | 0 refills | Status: DC

## 2020-06-22 NOTE — Telephone Encounter (Signed)
Pt is calling in stating that her Rx's atorvastatin (LIPITOR) 20 MG and Vitamin D Ergocalciferol (DRISDOL) 1.25 MG went to the incorrect pharmacy and would like to see if they can be sent to   [facility] and she would like to have a call to discuss one of the medications before taking.  Pt would not elaborate on which medication it is.

## 2020-06-22 NOTE — Patient Instructions (Signed)
-Nice seeing you today!!  -Lab work today; will notify you once results are available.  -Second shingles vaccine today.  -Schedule follow up in 1 year or sooner as needed.   Preventive Care 62-62 Years Old, Female Preventive care refers to lifestyle choices and visits with your health care provider that can promote health and wellness. This includes:  A yearly physical exam. This is also called an annual wellness visit.  Regular dental and eye exams.  Immunizations.  Screening for certain conditions.  Healthy lifestyle choices, such as: ? Eating a healthy diet. ? Getting regular exercise. ? Not using drugs or products that contain nicotine and tobacco. ? Limiting alcohol use. What can I expect for my preventive care visit? Physical exam Your health care provider will check your:  Height and weight. These may be used to calculate your BMI (body mass index). BMI is a measurement that tells if you are at a healthy weight.  Heart rate and blood pressure.  Body temperature.  Skin for abnormal spots. Counseling Your health care provider may ask you questions about your:  Past medical problems.  Family's medical history.  Alcohol, tobacco, and drug use.  Emotional well-being.  Home life and relationship well-being.  Sexual activity.  Diet, exercise, and sleep habits.  Work and work Statistician.  Access to firearms.  Method of birth control.  Menstrual cycle.  Pregnancy history. What immunizations do I need? Vaccines are usually given at various ages, according to a schedule. Your health care provider will recommend vaccines for you based on your age, medical history, and lifestyle or other factors, such as travel or where you work.   What tests do I need? Blood tests  Lipid and cholesterol levels. These may be checked every 5 years, or more often if you are over 62 years old.  Hepatitis C test.  Hepatitis B test. Screening  Lung cancer screening. You  may have this screening every year starting at age 62 if you have a 30-pack-year history of smoking and currently smoke or have quit within the past 15 years.  Colorectal cancer screening. ? All adults should have this screening starting at age 62 and continuing until age 11. ? Your health care provider may recommend screening at age 62 if you are at increased risk. ? You will have tests every 1-10 years, depending on your results and the type of screening test.  Diabetes screening. ? This is done by checking your blood sugar (glucose) after you have not eaten for a while (fasting). ? You may have this done every 1-3 years.  Mammogram. ? This may be done every 1-2 years. ? Talk with your health care provider about when you should start having regular mammograms. This may depend on whether you have a family history of breast cancer.  BRCA-related cancer screening. This may be done if you have a family history of breast, ovarian, tubal, or peritoneal cancers.  Pelvic exam and Pap test. ? This may be done every 3 years starting at age 62. ? Starting at age 62, this may be done every 5 years if you have a Pap test in combination with an HPV test. Other tests  STD (sexually transmitted disease) testing, if you are at risk.  Bone density scan. This is done to screen for osteoporosis. You may have this scan if you are at high risk for osteoporosis. Talk with your health care provider about your test results, treatment options, and if necessary, the need for more  tests. Follow these instructions at home: Eating and drinking  Eat a diet that includes fresh fruits and vegetables, whole grains, lean protein, and low-fat dairy products.  Take vitamin and mineral supplements as recommended by your health care provider.  Do not drink alcohol if: ? Your health care provider tells you not to drink. ? You are pregnant, may be pregnant, or are planning to become pregnant.  If you drink  alcohol: ? Limit how much you have to 0-1 drink a day. ? Be aware of how much alcohol is in your drink. In the [place] one drink equals one 12 oz bottle of beer (355 mL), one 5 oz glass of wine (148 mL), or one 1 oz glass of hard liquor (44 mL).   Lifestyle  Take daily care of your teeth and gums. Brush your teeth every morning and night with fluoride toothpaste. Floss one time each day.  Stay active. Exercise for at least 30 minutes 5 or more days each week.  Do not use any products that contain nicotine or tobacco, such as cigarettes, e-cigarettes, and chewing tobacco. If you need help quitting, ask your health care provider.  Do not use drugs.  If you are sexually active, practice safe sex. Use a condom or other form of protection to prevent STIs (sexually transmitted infections).  If you do not wish to become pregnant, use a form of birth control. If you plan to become pregnant, see your health care provider for a prepregnancy visit.  If told by your health care provider, take low-dose aspirin daily starting at age 62.  Find healthy ways to cope with stress, such as: ? Meditation, yoga, or listening to music. ? Journaling. ? Talking to a trusted person. ? Spending time with friends and family. Safety  Always wear your seat belt while driving or riding in a vehicle.  Do not drive: ? If you have been drinking alcohol. Do not ride with someone who has been drinking. ? When you are tired or distracted. ? While texting.  Wear a helmet and other protective equipment during sports activities.  If you have firearms in your house, make sure you follow all gun safety procedures. What's next?  Visit your health care provider once a year for an annual wellness visit.  Ask your health care provider how often you should have your eyes and teeth checked.  Stay up to date on all vaccines. This information is not intended to replace advice given to you by your health care provider. Make  sure you discuss any questions you have with your health care provider. Document Revised: 12/13/2019 Document Reviewed: 11/19/2017 Elsevier Patient Education  2021 [employer].

## 2020-06-22 NOTE — Telephone Encounter (Signed)
Rx sent to [facility]

## 2020-06-22 NOTE — Progress Notes (Signed)
Established Patient Office Visit     This visit occurred during the SARS-CoV-2 public health emergency.  Safety protocols were in place, including screening questions prior to the visit, additional usage of staff PPE, and extensive cleaning of exam room while observing appropriate contact time as indicated for disinfecting solutions.    CC/Reason for Visit: Annual preventive exam  HPI: April Cowan is a 60 y.o. female who is coming in today for the above mentioned reasons.  She has no past medical history of significance.  She is due for her second shingles vaccine that she would like to obtain today.  Otherwise immunizations are up-to-date.  She has her colonoscopy scheduled for May.  She had a mammogram in June of last year.  She has a GYN, Dr. Dellis Filbert, Pap smear was done last year.  She has routine eye and dental care.  She exercises routinely by walking 3 to 5 miles every day and jumping rope.   Past Medical/Surgical History: Past Medical History:  Diagnosis Date  . Spider veins   . Vaginal atrophy     No past surgical history on file.  Social History:  reports that she has never smoked. She has never used smokeless tobacco. She reports current alcohol use. She reports that she does not use drugs.  Allergies: Allergies  Allergen Reactions  . Sulfa Antibiotics     Family History:  Family History  Problem Relation Age of Onset  . Arthritis Paternal Grandmother   . Colon cancer Paternal Grandmother   . Breast cancer Paternal Grandmother   . Lung cancer Paternal Grandfather   . Hyperlipidemia Mother   . Heart disease Father   . Hypertension Father   . Atrial fibrillation Father      Current Outpatient Medications:  .  NONFORMULARY OR COMPOUNDED ITEM, Estradiol vaginal cream 0.02% insert 1 gram applicator twice weekly, Disp: 90 each, Rfl: 4  Review of Systems:  Constitutional: Denies fever, chills, diaphoresis, appetite change and fatigue.  HEENT: Denies  photophobia, eye pain, redness, hearing loss, ear pain, congestion, sore throat, rhinorrhea, sneezing, mouth sores, trouble swallowing, neck pain, neck stiffness and tinnitus.   Respiratory: Denies SOB, DOE, cough, chest tightness,  and wheezing.   Cardiovascular: Denies chest pain, palpitations and leg swelling.  Gastrointestinal: Denies nausea, vomiting, abdominal pain, diarrhea, constipation, blood in stool and abdominal distention.  Genitourinary: Denies dysuria, urgency, frequency, hematuria, flank pain and difficulty urinating.  Endocrine: Denies: hot or cold intolerance, sweats, changes in hair or nails, polyuria, polydipsia. Musculoskeletal: Denies myalgias, back pain, joint swelling, arthralgias and gait problem.  Skin: Denies pallor, rash and wound.  Neurological: Denies dizziness, seizures, syncope, weakness, light-headedness, numbness and headaches.  Hematological: Denies adenopathy. Easy bruising, personal or family bleeding history  Psychiatric/Behavioral: Denies suicidal ideation, mood changes, confusion, nervousness, sleep disturbance and agitation    Physical Exam: Vitals:   06/22/20 0815  BP: 110/70  Pulse: 68  Temp: 98 F (36.7 C)  TempSrc: Oral  SpO2: 95%  Weight: 161 lb 12.8 oz (73.4 kg)  Height: _0  (1.727 m)    Body mass index is 24.6 kg/m.   Constitutional: NAD, calm, comfortable Eyes: PERRL, lids and conjunctivae normal ENMT: Mucous membranes are moist. Posterior pharynx clear of any exudate or lesions. Normal dentition. Tympanic membrane is pearly white, no erythema or bulging. Neck: normal, supple, no masses, no thyromegaly Respiratory: clear to auscultation bilaterally, no wheezing, no crackles. Normal respiratory effort. No accessory muscle use.  Cardiovascular: Regular  rate and rhythm, no murmurs / rubs / gallops. No extremity edema. 2+ pedal pulses.  Abdomen: no tenderness, no masses palpated. No hepatosplenomegaly. Bowel sounds positive.   Musculoskeletal: no clubbing / cyanosis. No joint deformity upper and lower extremities. Good ROM, no contractures. Normal muscle tone.  Skin: no rashes, lesions, ulcers. No induration Neurologic: CN 2-12 grossly intact. Sensation intact, DTR normal. Strength 5/5 in all 4.  Psychiatric: Normal judgment and insight. Alert and oriented x 3. Normal mood.    Impression and Plan:  Encounter for preventive health examination  -She has routine eye and dental care. -Shingles series has been completed today, otherwise immunizations are up-to-date including COVID. -Screening labs today. -Healthy lifestyle discussed in detail. -Her initial screening colonoscopy is overdue but has been scheduled for me. -She had a mammogram in July 2021 that was normal. -She has Pap smears through her GYN, last in 2021 that was normal.  Need for shingles vaccine -Second shingles vaccine administered today.   Patient Instructions   -Nice seeing you today!!  -Lab work today; will notify you once results are available.  -Second shingles vaccine today.  -Schedule follow up in 1 year or sooner as needed.   Preventive Care 7-68 Years Old, Female Preventive care refers to lifestyle choices and visits with your health care provider that can promote health and wellness. This includes:  A yearly physical exam. This is also called an annual wellness visit.  Regular dental and eye exams.  Immunizations.  Screening for certain conditions.  Healthy lifestyle choices, such as: ? Eating a healthy diet. ? Getting regular exercise. ? Not using drugs or products that contain nicotine and tobacco. ? Limiting alcohol use. What can I expect for my preventive care visit? Physical exam Your health care provider will check your:  Height and weight. These may be used to calculate your BMI (body mass index). BMI is a measurement that tells if you are at a healthy weight.  Heart rate and blood pressure.  Body  temperature.  Skin for abnormal spots. Counseling Your health care provider may ask you questions about your:  Past medical problems.  Family's medical history.  Alcohol, tobacco, and drug use.  Emotional well-being.  Home life and relationship well-being.  Sexual activity.  Diet, exercise, and sleep habits.  Work and work Statistician.  Access to firearms.  Method of birth control.  Menstrual cycle.  Pregnancy history. What immunizations do I need? Vaccines are usually given at various ages, according to a schedule. Your health care provider will recommend vaccines for you based on your age, medical history, and lifestyle or other factors, such as travel or where you work.   What tests do I need? Blood tests  Lipid and cholesterol levels. These may be checked every 5 years, or more often if you are over 100 years old.  Hepatitis C test.  Hepatitis B test. Screening  Lung cancer screening. You may have this screening every year starting at age 55 if you have a 30-pack-year history of smoking and currently smoke or have quit within the past 15 years.  Colorectal cancer screening. ? All adults should have this screening starting at age 60 and continuing until age 32. ? Your health care provider may recommend screening at age 18 if you are at increased risk. ? You will have tests every 1-10 years, depending on your results and the type of screening test.  Diabetes screening. ? This is done by checking your blood sugar (glucose) after  you have not eaten for a while (fasting). ? You may have this done every 1-3 years.  Mammogram. ? This may be done every 1-2 years. ? Talk with your health care provider about when you should start having regular mammograms. This may depend on whether you have a family history of breast cancer.  BRCA-related cancer screening. This may be done if you have a family history of breast, ovarian, tubal, or peritoneal cancers.  Pelvic exam  and Pap test. ? This may be done every 3 years starting at age 52. ? Starting at age 14, this may be done every 5 years if you have a Pap test in combination with an HPV test. Other tests  STD (sexually transmitted disease) testing, if you are at risk.  Bone density scan. This is done to screen for osteoporosis. You may have this scan if you are at high risk for osteoporosis. Talk with your health care provider about your test results, treatment options, and if necessary, the need for more tests. Follow these instructions at home: Eating and drinking  Eat a diet that includes fresh fruits and vegetables, whole grains, lean protein, and low-fat dairy products.  Take vitamin and mineral supplements as recommended by your health care provider.  Do not drink alcohol if: ? Your health care provider tells you not to drink. ? You are pregnant, may be pregnant, or are planning to become pregnant.  If you drink alcohol: ? Limit how much you have to 0-1 drink a day. ? Be aware of how much alcohol is in your drink. In the [place] one drink equals one 12 oz bottle of beer (355 mL), one 5 oz glass of wine (148 mL), or one 1 oz glass of hard liquor (44 mL).   Lifestyle  Take daily care of your teeth and gums. Brush your teeth every morning and night with fluoride toothpaste. Floss one time each day.  Stay active. Exercise for at least 30 minutes 5 or more days each week.  Do not use any products that contain nicotine or tobacco, such as cigarettes, e-cigarettes, and chewing tobacco. If you need help quitting, ask your health care provider.  Do not use drugs.  If you are sexually active, practice safe sex. Use a condom or other form of protection to prevent STIs (sexually transmitted infections).  If you do not wish to become pregnant, use a form of birth control. If you plan to become pregnant, see your health care provider for a prepregnancy visit.  If told by your health care provider, take  low-dose aspirin daily starting at age 19.  Find healthy ways to cope with stress, such as: ? Meditation, yoga, or listening to music. ? Journaling. ? Talking to a trusted person. ? Spending time with friends and family. Safety  Always wear your seat belt while driving or riding in a vehicle.  Do not drive: ? If you have been drinking alcohol. Do not ride with someone who has been drinking. ? When you are tired or distracted. ? While texting.  Wear a helmet and other protective equipment during sports activities.  If you have firearms in your house, make sure you follow all gun safety procedures. What's next?  Visit your health care provider once a year for an annual wellness visit.  Ask your health care provider how often you should have your eyes and teeth checked.  Stay up to date on all vaccines. This information is not intended to replace advice given  to you by your health care provider. Make sure you discuss any questions you have with your health care provider. Document Revised: 12/13/2019 Document Reviewed: 11/19/2017 Elsevier Patient Education  2021 [place], [place] [facility]

## 2020-06-26 ENCOUNTER — Other Ambulatory Visit: Payer: Self-pay | Admitting: Internal Medicine

## 2020-06-26 DIAGNOSIS — E559 Vitamin D deficiency, unspecified: Secondary | ICD-10-CM

## 2020-06-27 ENCOUNTER — Ambulatory Visit (INDEPENDENT_AMBULATORY_CARE_PROVIDER_SITE_OTHER): Payer: 59 | Admitting: *Deleted

## 2020-06-27 ENCOUNTER — Other Ambulatory Visit: Payer: Self-pay

## 2020-06-27 DIAGNOSIS — E538 Deficiency of other specified B group vitamins: Secondary | ICD-10-CM

## 2020-06-27 MED ORDER — CYANOCOBALAMIN 1000 MCG/ML IJ SOLN
1000.0000 ug | Freq: Once | INTRAMUSCULAR | Status: AC
Start: 2020-06-27 — End: 2020-06-27
  Administered 2020-06-27: 1000 ug via INTRAMUSCULAR

## 2020-06-27 NOTE — Progress Notes (Signed)
Per orders of Dr. Hernandez, injection of b12 given by Yvett Rossel. Patient tolerated injection well.  

## 2020-07-03 ENCOUNTER — Other Ambulatory Visit: Payer: Self-pay

## 2020-07-04 ENCOUNTER — Ambulatory Visit (INDEPENDENT_AMBULATORY_CARE_PROVIDER_SITE_OTHER): Payer: 59 | Admitting: *Deleted

## 2020-07-04 DIAGNOSIS — E538 Deficiency of other specified B group vitamins: Secondary | ICD-10-CM

## 2020-07-04 MED ORDER — CYANOCOBALAMIN 1000 MCG/ML IJ SOLN
1000.0000 ug | Freq: Once | INTRAMUSCULAR | Status: AC
  Administered 2020-07-04: 1000 ug via INTRAMUSCULAR

## 2020-07-04 NOTE — Progress Notes (Signed)
Per orders of Dr. Hernandez, injection of B12 given by Egor Fullilove. Patient tolerated injection well.  

## 2020-07-05 ENCOUNTER — Ambulatory Visit: Payer: 59

## 2020-07-05 ENCOUNTER — Other Ambulatory Visit: Payer: Self-pay

## 2020-07-05 VITALS — Ht 68.0 in | Wt 155.0 lb

## 2020-07-05 DIAGNOSIS — Z1211 Encounter for screening for malignant neoplasm of colon: Secondary | ICD-10-CM

## 2020-07-05 MED ORDER — NA SULFATE-K SULFATE-MG SULF 17.5-3.13-1.6 GM/177ML PO SOLN: 1.0000 | Freq: Once | ORAL | 0 refills | Status: AC

## 2020-07-05 NOTE — Progress Notes (Signed)
Pt verified name, DOB, address and insurance during PV today.   Pt mailed instruction packet: copy of consent form to read and not return, and instructions.  PV completed over the phone. Pt encouraged to call with questions or issues.   No allergies to soy or egg Pt is not on blood thinners or diet pills Issues with sedation/intubation no assessable pt has not been put to sleep, but was sedated for dental work Denies atrial flutter/fib Denies constipation    Pt is aware of Covid safety and care partner requirements.

## 2020-07-11 ENCOUNTER — Other Ambulatory Visit: Payer: Self-pay

## 2020-07-11 ENCOUNTER — Ambulatory Visit (INDEPENDENT_AMBULATORY_CARE_PROVIDER_SITE_OTHER): Payer: 59 | Admitting: *Deleted

## 2020-07-11 DIAGNOSIS — E538 Deficiency of other specified B group vitamins: Secondary | ICD-10-CM | POA: Diagnosis not present

## 2020-07-11 MED ORDER — CYANOCOBALAMIN 1000 MCG/ML IJ SOLN
1000.0000 ug | Freq: Once | INTRAMUSCULAR | Status: AC
  Administered 2020-07-11: 1000 ug via INTRAMUSCULAR

## 2020-07-11 NOTE — Progress Notes (Signed)
Per orders of Dr. Hernandez, injection of B12 given by Rochella Benner. Patient tolerated injection well.  

## 2020-07-17 ENCOUNTER — Other Ambulatory Visit: Payer: Self-pay

## 2020-07-18 ENCOUNTER — Ambulatory Visit (INDEPENDENT_AMBULATORY_CARE_PROVIDER_SITE_OTHER): Payer: 59 | Admitting: *Deleted

## 2020-07-18 DIAGNOSIS — E538 Deficiency of other specified B group vitamins: Secondary | ICD-10-CM

## 2020-07-18 MED ORDER — CYANOCOBALAMIN 1000 MCG/ML IJ SOLN
1000.0000 ug | Freq: Once | INTRAMUSCULAR | Status: AC
  Administered 2020-07-18: 1000 ug via INTRAMUSCULAR

## 2020-07-18 MED ORDER — CYANOCOBALAMIN 1000 MCG/ML IJ SOLN: 1000.0000 ug | Freq: Once | INTRAMUSCULAR | Status: DC

## 2020-07-18 NOTE — Progress Notes (Signed)
Per orders of Cory Nafziger, injection of B12 given by Brehanna Deveny. Patient tolerated injection well. 

## 2020-07-23 ENCOUNTER — Encounter: Payer: Self-pay | Admitting: Gastroenterology

## 2020-07-23 ENCOUNTER — Other Ambulatory Visit: Payer: Self-pay

## 2020-07-23 ENCOUNTER — Ambulatory Visit (AMBULATORY_SURGERY_CENTER): Payer: 59 | Admitting: Gastroenterology

## 2020-07-23 ENCOUNTER — Other Ambulatory Visit: Payer: Self-pay | Admitting: Gastroenterology

## 2020-07-23 VITALS — BP 115/76 | HR 52 | Temp 98.2°F | Resp 15 | Ht 68.0 in | Wt 155.0 lb

## 2020-07-23 DIAGNOSIS — Z1211 Encounter for screening for malignant neoplasm of colon: Secondary | ICD-10-CM

## 2020-07-23 DIAGNOSIS — D123 Benign neoplasm of transverse colon: Secondary | ICD-10-CM | POA: Diagnosis not present

## 2020-07-23 DIAGNOSIS — K635 Polyp of colon: Secondary | ICD-10-CM

## 2020-07-23 DIAGNOSIS — D125 Benign neoplasm of sigmoid colon: Secondary | ICD-10-CM

## 2020-07-23 MED ORDER — SODIUM CHLORIDE 0.9 % IV SOLN: 500.0000 mL | Freq: Once | INTRAVENOUS | Status: DC

## 2020-07-23 NOTE — Op Note (Signed)
[facility] Patient Name: April Cowan Procedure Date: 07/23/2020 2:06 PM MRN: [id number] Endoscopist: Ladene Artist , MD Age: 60 Referring MD:  Date of Birth: March 11, 1961 Gender: Female Account #: [id number] Procedure:                Colonoscopy Indications:              Screening for colorectal malignant neoplasm Medicines:                Monitored Anesthesia Care Procedure:                Pre-Anesthesia Assessment:                           - Prior to the procedure, a History and Physical                            was performed, and patient medications and                            allergies were reviewed. The patient's tolerance of                            previous anesthesia was also reviewed. The risks                            and benefits of the procedure and the sedation                            options and risks were discussed with the patient.                            All questions were answered, and informed consent                            was obtained. Prior Anticoagulants: The patient has                            taken no previous anticoagulant or antiplatelet                            agents. ASA Grade Assessment: II - A patient with                            mild systemic disease. After reviewing the risks                            and benefits, the patient was deemed in                            satisfactory condition to undergo the procedure.                           After obtaining informed consent, the colonoscope  was passed under direct vision. Throughout the                            procedure, the patient's blood pressure, pulse, and                            oxygen saturations were monitored continuously. The                            Olympus PCF-H190DL ([id number]) Colonoscope was                            introduced through the anus and advanced to the the                            cecum, identified  by appendiceal orifice and                            ileocecal valve. The ileocecal valve, appendiceal                            orifice, and rectum were photographed. The quality                            of the bowel preparation was adequate after                            extensiv lavage and suction. The colonoscopy was                            performed without difficulty. The patient tolerated                            the procedure well. Scope In: 2:14:11 PM Scope Out: 2:35:55 PM Scope Withdrawal Time: 0 hours 16 minutes 9 seconds  Total Procedure Duration: 0 hours 21 minutes 44 seconds  Findings:                 The perianal and digital rectal examinations were                            normal.                           A 12 mm polyp was found in the transverse colon.                            The polyp was sessile. The polyp was removed with a                            cold snare. Resection and retrieval were complete.                           Two sessile polyps were found in the sigmoid colon.  The polyps were 6 to 7 mm in size. These polyps                            were removed with a cold snare. Resection and                            retrieval were complete.                           Many small-mouthed diverticula were found in the                            entire colon, more concentrated in the left colon.                            There was no evidence of diverticular bleeding.                           The exam was otherwise without abnormality on                            direct and retroflexion views. Complications:            No immediate complications. Estimated blood loss:                            None. Estimated Blood Loss:     Estimated blood loss: none. Impression:               - One 12 mm polyp in the transverse colon, removed                            with a cold snare. Resected and retrieved.                            - Two 6 to 7 mm polyps in the sigmoid colon,                            removed with a cold snare. Resected and retrieved.                           - Mild diverticulosis in the entire examined colon.                           - The examination was otherwise normal on direct                            and retroflexion views. Recommendation:           - Repeat colonoscopy date to be determined after                            pending pathology results are reviewed for  surveillance based on pathology results with a more                            extensive bowel prep.                           - Patient has a contact number available for                            emergencies. The signs and symptoms of potential                            delayed complications were discussed with the                            patient. Return to normal activities tomorrow.                            Written discharge instructions were provided to the                            patient.                           - High fiber diet.                           - Continue present medications.                           - Await pathology results. Ladene Artist, MD 07/23/2020 2:40:08 PM This report has been signed electronically.

## 2020-07-23 NOTE — Patient Instructions (Signed)
YOU HAD AN ENDOSCOPIC PROCEDURE TODAY AT THE [facility]:   Refer to the procedure report that was given to you for any specific questions about what was found during the examination.  If the procedure report does not answer your questions, please call your gastroenterologist to clarify.  If you requested that your care partner not be given the details of your procedure findings, then the procedure report has been included in a sealed envelope for you to review at your convenience later.  YOU SHOULD EXPECT: Some feelings of bloating in the abdomen. Passage of more gas than usual.  Walking can help get rid of the air that was put into your GI tract during the procedure and reduce the bloating. If you had a lower endoscopy (such as a colonoscopy or flexible sigmoidoscopy) you may notice spotting of blood in your stool or on the toilet paper. If you underwent a bowel prep for your procedure, you may not have a normal bowel movement for a few days.  Please Note:  You might notice some irritation and congestion in your nose or some drainage.  This is from the oxygen used during your procedure.  There is no need for concern and it should clear up in a day or so.  SYMPTOMS TO REPORT IMMEDIATELY:   Following lower endoscopy (colonoscopy or flexible sigmoidoscopy):  Excessive amounts of blood in the stool  Significant tenderness or worsening of abdominal pains  Swelling of the abdomen that is new, acute  Fever of 100F or higher   Following upper endoscopy (EGD)  Vomiting of blood or coffee ground material  New chest pain or pain under the shoulder blades  Painful or persistently difficult swallowing  New shortness of breath  Fever of 100F or higher  Black, tarry-looking stools  For urgent or emergent issues, a gastroenterologist can be reached at any hour by calling [phone]. Do not use MyChart messaging for urgent concerns.    DIET:  We do recommend a small meal at first, but  then you may proceed to your regular diet.  Drink plenty of fluids but you should avoid alcoholic beverages for 24 hours.  ACTIVITY:  You should plan to take it easy for the rest of today and you should NOT DRIVE or use heavy machinery until tomorrow (because of the sedation medicines used during the test).    FOLLOW UP: Our staff will call the number listed on your records 48-72 hours following your procedure to check on you and address any questions or concerns that you may have regarding the information given to you following your procedure. If we do not reach you, we will leave a message.  We will attempt to reach you two times.  During this call, we will ask if you have developed any symptoms of COVID 19. If you develop any symptoms (ie: fever, flu-like symptoms, shortness of breath, cough etc.) before then, please call [phone].  If you test positive for Covid 19 in the 2 weeks post procedure, please call and report this information to us.    If any biopsies were taken you will be contacted by phone or by letter within the next 1-3 weeks.  Please call us at [phone] if you have not heard about the biopsies in 3 weeks.    SIGNATURES/CONFIDENTIALITY: You and/or your care partner have signed paperwork which will be entered into your electronic medical record.  These signatures attest to the fact that that the information above on   your After Visit Summary has been reviewed and is understood.  Full responsibility of the confidentiality of this discharge information lies with you and/or your care-partner. 

## 2020-07-23 NOTE — Progress Notes (Signed)
Medical history reviewed with no changes since PV. VS assessed by C.W 

## 2020-07-23 NOTE — Progress Notes (Signed)
Called to room to assist during endoscopic procedure.  Patient ID and intended procedure confirmed with present staff. Received instructions for my participation in the procedure from the performing physician.  

## 2020-07-23 NOTE — Progress Notes (Signed)
To PACU, VSS. Report to Rn.tb 

## 2020-07-25 ENCOUNTER — Telehealth: Payer: Self-pay | Admitting: *Deleted

## 2020-07-25 ENCOUNTER — Telehealth: Payer: Self-pay

## 2020-07-25 NOTE — Telephone Encounter (Signed)
Left message on follow up call. 

## 2020-07-25 NOTE — Telephone Encounter (Signed)
  Follow up Call-  Call back number 07/23/2020  Post procedure Call Back phone  # [phone]  Permission to leave phone message Yes  Some recent data might be hidden     Patient questions:  Do you have a fever, pain , or abdominal swelling? No. Pain Score  0 *  Have you tolerated food without any problems? Yes.    Have you been able to return to your normal activities? Yes.    Do you have any questions about your discharge instructions: Diet   No. Medications  No. Follow up visit  No.  Do you have questions or concerns about your Care? No.  Actions: * If pain score is 4 or above: No action needed, pain <4.  1. Have you developed a fever since your procedure? no  2.   Have you had an respiratory symptoms (SOB or cough) since your procedure? no  3.   Have you tested positive for COVID 19 since your procedure no  4.   Have you had any family members/close contacts diagnosed with the COVID 19 since your procedure? no   If yes to any of these questions please route to Joylene John, RN and Joella Prince, RN

## 2020-08-08 ENCOUNTER — Encounter: Payer: Self-pay | Admitting: Gastroenterology

## 2020-08-24 ENCOUNTER — Telehealth: Payer: Self-pay

## 2020-08-24 ENCOUNTER — Ambulatory Visit (INDEPENDENT_AMBULATORY_CARE_PROVIDER_SITE_OTHER): Payer: 59

## 2020-08-24 ENCOUNTER — Other Ambulatory Visit: Payer: Self-pay

## 2020-08-24 DIAGNOSIS — E538 Deficiency of other specified B group vitamins: Secondary | ICD-10-CM | POA: Diagnosis not present

## 2020-08-24 MED ORDER — CYANOCOBALAMIN 1000 MCG/ML IJ SOLN
1000.0000 ug | Freq: Once | INTRAMUSCULAR | Status: AC
  Administered 2020-08-24: 1000 ug via INTRAMUSCULAR

## 2020-08-24 NOTE — Telephone Encounter (Signed)
4 months after her 1st booster (3rd vaccine)

## 2020-08-24 NOTE — Progress Notes (Signed)
Per orders of Dr. Jerilee Hoh, injection of B12given by Rodrigo Ran. Patient tolerated injection well.

## 2020-08-24 NOTE — Telephone Encounter (Signed)
Patient is aware 

## 2020-08-24 NOTE — Telephone Encounter (Signed)
Patient is wanting to know when she should get the 4th covid vaccine?

## 2020-09-10 ENCOUNTER — Other Ambulatory Visit: Payer: Self-pay | Admitting: Internal Medicine

## 2020-09-10 DIAGNOSIS — E559 Vitamin D deficiency, unspecified: Secondary | ICD-10-CM

## 2020-09-21 ENCOUNTER — Other Ambulatory Visit: Payer: Self-pay

## 2020-09-25 ENCOUNTER — Ambulatory Visit (INDEPENDENT_AMBULATORY_CARE_PROVIDER_SITE_OTHER): Payer: 59

## 2020-09-25 ENCOUNTER — Other Ambulatory Visit: Payer: Self-pay

## 2020-09-25 DIAGNOSIS — E538 Deficiency of other specified B group vitamins: Secondary | ICD-10-CM | POA: Diagnosis not present

## 2020-09-25 MED ORDER — CYANOCOBALAMIN 1000 MCG/ML IJ SOLN
1000.0000 ug | Freq: Once | INTRAMUSCULAR | Status: AC
  Administered 2020-09-25: 1000 ug via INTRAMUSCULAR

## 2020-09-25 NOTE — Progress Notes (Signed)
Patient was given B12 injection per orders of Dr. Jerilee Hoh by Madaline Guthrie, pt tolerated well.

## 2020-10-15 ENCOUNTER — Other Ambulatory Visit: Payer: Self-pay | Admitting: Obstetrics & Gynecology

## 2020-10-15 DIAGNOSIS — Z1231 Encounter for screening mammogram for malignant neoplasm of breast: Secondary | ICD-10-CM

## 2020-10-19 ENCOUNTER — Ambulatory Visit
Admission: RE | Admit: 2020-10-19 | Discharge: 2020-10-19 | Disposition: A | Discharge status: Home / Self Care | Payer: 59 | Source: Ambulatory Visit

## 2020-10-19 ENCOUNTER — Other Ambulatory Visit: Payer: Self-pay

## 2020-10-19 DIAGNOSIS — Z1231 Encounter for screening mammogram for malignant neoplasm of breast: Secondary | ICD-10-CM

## 2020-10-25 ENCOUNTER — Other Ambulatory Visit: Payer: Self-pay

## 2020-10-25 ENCOUNTER — Ambulatory Visit (INDEPENDENT_AMBULATORY_CARE_PROVIDER_SITE_OTHER): Payer: 59

## 2020-10-25 DIAGNOSIS — E538 Deficiency of other specified B group vitamins: Secondary | ICD-10-CM

## 2020-10-25 MED ORDER — CYANOCOBALAMIN 1000 MCG/ML IJ SOLN
1000.0000 ug | Freq: Once | INTRAMUSCULAR | Status: AC
  Administered 2020-10-25: 1000 ug via INTRAMUSCULAR

## 2020-10-25 NOTE — Progress Notes (Signed)
Per orders of Dr. Hernandez, injection of B12 given by Kimyatta Lecy. Patient tolerated injection well.  

## 2020-11-30 ENCOUNTER — Ambulatory Visit (INDEPENDENT_AMBULATORY_CARE_PROVIDER_SITE_OTHER): Payer: 59

## 2020-11-30 ENCOUNTER — Other Ambulatory Visit: Payer: Self-pay | Admitting: Internal Medicine

## 2020-11-30 ENCOUNTER — Other Ambulatory Visit: Payer: Self-pay

## 2020-11-30 DIAGNOSIS — E559 Vitamin D deficiency, unspecified: Secondary | ICD-10-CM

## 2020-11-30 DIAGNOSIS — E538 Deficiency of other specified B group vitamins: Secondary | ICD-10-CM

## 2020-11-30 MED ORDER — CYANOCOBALAMIN 1000 MCG/ML IJ SOLN
1000.0000 ug | Freq: Once | INTRAMUSCULAR | Status: AC
  Administered 2020-11-30: 1000 ug via INTRAMUSCULAR

## 2020-11-30 NOTE — Patient Instructions (Signed)
Health Maintenance Due  Topic Date Due   COVID-19 Vaccine (4 - Booster for Pfizer series) 04/24/2020   INFLUENZA VACCINE  10/22/2020    Depression screen [facility] 2/9 06/22/2020 04/05/2020  Decreased Interest 0 0  Down, Depressed, Hopeless 0 0  PHQ - 2 Score 0 0  Altered sleeping 0 0  Tired, decreased energy 0 0  Change in appetite 0 0  Feeling bad or failure about yourself  0 0  Trouble concentrating 0 0  Moving slowly or fidgety/restless 0 0  Suicidal thoughts 0 0  PHQ-9 Score 0 0  Difficult doing work/chores Not difficult at all Not difficult at all

## 2020-11-30 NOTE — Progress Notes (Signed)
Per orders of Isaac Bliss, Rayford Halsted, MD, injection of B12 given in left deltoid by Franco Collet. Patient tolerated injection well.  Lab Results  Component Value Date   VITAMINB12 150 (L) 06/22/2020

## 2020-12-15 ENCOUNTER — Other Ambulatory Visit: Payer: Self-pay | Admitting: Internal Medicine

## 2020-12-15 DIAGNOSIS — E785 Hyperlipidemia, unspecified: Secondary | ICD-10-CM

## 2021-01-01 ENCOUNTER — Other Ambulatory Visit: Payer: Self-pay

## 2021-01-01 ENCOUNTER — Ambulatory Visit (INDEPENDENT_AMBULATORY_CARE_PROVIDER_SITE_OTHER): Payer: 59

## 2021-01-01 DIAGNOSIS — E538 Deficiency of other specified B group vitamins: Secondary | ICD-10-CM | POA: Diagnosis not present

## 2021-01-01 MED ORDER — CYANOCOBALAMIN 1000 MCG/ML IJ SOLN
1000.0000 ug | Freq: Once | INTRAMUSCULAR | Status: AC
  Administered 2021-01-01: 1000 ug via INTRAMUSCULAR

## 2021-01-01 NOTE — Progress Notes (Signed)
Per orders from Dr Jerilee Hoh, pt was given B12 injection in left deltoid by Nikyla Navedo, pt tolerated well.

## 2021-02-04 ENCOUNTER — Ambulatory Visit: Payer: 59

## 2021-02-04 DIAGNOSIS — E538 Deficiency of other specified B group vitamins: Secondary | ICD-10-CM

## 2021-02-04 MED ORDER — CYANOCOBALAMIN 1000 MCG/ML IJ SOLN: 1000.0000 ug | Freq: Once | INTRAMUSCULAR | Status: DC

## 2021-02-04 NOTE — Progress Notes (Signed)
   Per orders of Isaac Bliss, Rayford Halsted, MD, injection of B12 given in R deltoid by Franco Collet. Patient tolerated injection well.  Lab Results  Component Value Date   VITAMINB12 150 (L) 06/22/2020

## 2021-02-18 ENCOUNTER — Ambulatory Visit (INDEPENDENT_AMBULATORY_CARE_PROVIDER_SITE_OTHER): Payer: 59 | Admitting: Obstetrics & Gynecology

## 2021-02-18 ENCOUNTER — Other Ambulatory Visit (HOSPITAL_COMMUNITY)
Admission: RE | Admit: 2021-02-18 | Discharge: 2021-02-18 | Disposition: A | Discharge status: Home / Self Care | Payer: 59 | Source: Ambulatory Visit | Attending: Obstetrics & Gynecology | Admitting: Obstetrics & Gynecology

## 2021-02-18 ENCOUNTER — Other Ambulatory Visit: Payer: Self-pay

## 2021-02-18 ENCOUNTER — Encounter: Payer: Self-pay | Admitting: Obstetrics & Gynecology

## 2021-02-18 VITALS — BP 118/72 | HR 65 | Ht 66.75 in | Wt 163.0 lb

## 2021-02-18 DIAGNOSIS — N952 Postmenopausal atrophic vaginitis: Secondary | ICD-10-CM | POA: Diagnosis not present

## 2021-02-18 DIAGNOSIS — M8589 Other specified disorders of bone density and structure, multiple sites: Secondary | ICD-10-CM

## 2021-02-18 DIAGNOSIS — Z01419 Encounter for gynecological examination (general) (routine) without abnormal findings: Secondary | ICD-10-CM | POA: Diagnosis present

## 2021-02-18 DIAGNOSIS — R87610 Atypical squamous cells of undetermined significance on cytologic smear of cervix (ASC-US): Secondary | ICD-10-CM | POA: Diagnosis present

## 2021-02-18 DIAGNOSIS — Z780 Asymptomatic menopausal state: Secondary | ICD-10-CM | POA: Diagnosis not present

## 2021-02-18 NOTE — Progress Notes (Signed)
April Cowan 1960/05/09 [id number]   History:    60 y.o. G2P2L2  Married.  Retired.  Daughter 53 yo Pediatric OT.  Daughter 85 yo [place] school in Pediatric OT.       RP:  Established patient presenting for annual gyn exam    HPI: Postmenopause, well without hormone replacement therapy.  No postmenopausal bleeding.  No pelvic pain.  Normal vaginal secretions.  Pap ASCUS/HPV HR Neg 12/2019. Superficial dyspareunia improved on Estradiol cream, would like to continue. Urine and bowel movements normal.  Breasts normal. Screening mammo 09/2020 Neg.  BMI 25.72.  Physically active.  Health labs with family physician.  Screening colonoscopy 07/2020.   Past medical history,surgical history, family history and social history were all reviewed and documented in the EPIC chart.  Gynecologic History Patient's last menstrual period was 04/07/2012.  Obstetric History OB History  Gravida Para Term Preterm AB Living  2 2       2   SAB IAB Ectopic Multiple Live Births               # Outcome Date GA Lbr Len/2nd Weight Sex Delivery Anes PTL Lv  2 Para           1 Para              ROS: A ROS was performed and pertinent positives and negatives are included in the history.  GENERAL: No fevers or chills. HEENT: No change in vision, no earache, sore throat or sinus congestion. NECK: No pain or stiffness. CARDIOVASCULAR: No chest pain or pressure. No palpitations. PULMONARY: No shortness of breath, cough or wheeze. GASTROINTESTINAL: No abdominal pain, nausea, vomiting or diarrhea, melena or bright red blood per rectum. GENITOURINARY: No urinary frequency, urgency, hesitancy or dysuria. MUSCULOSKELETAL: No joint or muscle pain, no back pain, no recent trauma. DERMATOLOGIC: No rash, no itching, no lesions. ENDOCRINE: No polyuria, polydipsia, no heat or cold intolerance. No recent change in weight. HEMATOLOGICAL: No anemia or easy bruising or bleeding. NEUROLOGIC: No headache, seizures, numbness, tingling or  weakness. PSYCHIATRIC: No depression, no loss of interest in normal activity or change in sleep pattern.     Exam:   BP 118/72   Pulse 65   Ht 5' 6.75" (1.695 m)   Wt 163 lb (73.9 kg)   LMP 04/07/2012   SpO2 98%   BMI 25.72 kg/m   Body mass index is 25.72 kg/m.  General appearance : Well developed well nourished female. No acute distress HEENT: Eyes: no retinal hemorrhage or exudates,  Neck supple, trachea midline, no carotid bruits, no thyroidmegaly Lungs: Clear to auscultation, no rhonchi or wheezes, or rib retractions  Heart: Regular rate and rhythm, no murmurs or gallops Breast:Examined in sitting and supine position were symmetrical in appearance, no palpable masses or tenderness,  no skin retraction, no nipple inversion, no nipple discharge, no skin discoloration, no axillary or supraclavicular lymphadenopathy Abdomen: no palpable masses or tenderness, no rebound or guarding Extremities: no edema or skin discoloration or tenderness  Pelvic: Vulva: Normal             Vagina: No gross lesions or discharge  Cervix: No gross lesions or discharge.  Pap Reflex done.  Uterus  AV, normal size, shape and consistency, non-tender and mobile  Adnexa  Without masses or tenderness  Anus: Normal   Assessment/Plan:  60 y.o. female for annual exam   1. Encounter for routine gynecological examination with Papanicolaou smear of cervix Postmenopause, well without  hormone replacement therapy.  No postmenopausal bleeding.  No pelvic pain.  Normal vaginal secretions.  Pap ASCUS/HPV HR Neg 12/2019.  Pap reflex done today.  Superficial dyspareunia improved on Estradiol cream, would like to continue. Urine and bowel movements normal.  Breasts normal. Screening mammo 09/2020 Neg.  BMI 25.72.  Physically active.  Health labs with family physician.  Screening colonoscopy 07/2020.  - Cytology - PAP( [facility])  2. ASCUS of cervix with negative high risk HPV - Cytology - PAP( [facility])  3.  Postmenopause Well on no systemic HRT.  No PMB.  4. Postmenopausal atrophic vaginitis Well on Compound Estradiol cream vaginally twice a week.  No CI to continue.  Will send prescription to [facility].  5. Osteopenia of multiple sites Very mild Osteopenia on BD 11/2019.  Recommend repeating at 3 yrs in 11/2022.  Continue Vit D/Ca++ and regular weight bearing physical activities.  Other orders - VITAMIN D PO; Take by mouth. - estradiol (ESTRACE) 0.1 MG/GM vaginal cream; Place 1 g vaginally 2 (two) times a week.   [name] MD, 1:42 PM 02/18/2021

## 2021-02-19 ENCOUNTER — Telehealth: Payer: Self-pay

## 2021-02-19 MED ORDER — NONFORMULARY OR COMPOUNDED ITEM: 4 refills | Status: DC

## 2021-02-19 NOTE — Telephone Encounter (Signed)
RE: Estradiol cream prescription to [facility]: Today [name], MD  Ramond Craver, RMA Yes, Estradiol vaginal cream 0.02%.

## 2021-02-19 NOTE — Telephone Encounter (Signed)
I received note from Dr. Marguerita Merles to call in patient's Compound Estradiol cream to [facility].  Previously she received it at [facility]. I left message for her to call so I can confirm she is agreeable to getting it at [facility].

## 2021-02-20 LAB — CYTOLOGY - PAP
Comment: NEGATIVE
Diagnosis: UNDETERMINED — AB
High risk HPV: NEGATIVE

## 2021-02-20 NOTE — Telephone Encounter (Signed)
I spoke with patient and let her know that [facility] no longer compounding Estrogen Vag Cream and offered to send her Rx to [facility].  Patient decided that she would just like to go on the commercial brand of estrogen cream instead of the compound.

## 2021-02-20 NOTE — Telephone Encounter (Signed)
Patient called in voice mail. I returned her call and left message to call.

## 2021-02-26 ENCOUNTER — Other Ambulatory Visit: Payer: Self-pay

## 2021-02-26 DIAGNOSIS — R87610 Atypical squamous cells of undetermined significance on cytologic smear of cervix (ASC-US): Secondary | ICD-10-CM

## 2021-02-26 MED ORDER — ESTRADIOL 0.1 MG/GM VA CREA: TOPICAL_CREAM | VAGINAL | 3 refills | Status: DC

## 2021-02-26 NOTE — Telephone Encounter (Signed)
I spoke with Dr. Marguerita Merles because patient called today.  Dr. Marguerita Merles gave verbal okay to prescribed the Estradiol 0.1 mg cream to use one gram vag twice weekly. Patient informed.

## 2021-03-05 ENCOUNTER — Encounter: Payer: Self-pay | Admitting: Obstetrics & Gynecology

## 2021-03-05 ENCOUNTER — Ambulatory Visit (INDEPENDENT_AMBULATORY_CARE_PROVIDER_SITE_OTHER): Payer: 59 | Admitting: Obstetrics & Gynecology

## 2021-03-05 ENCOUNTER — Other Ambulatory Visit (HOSPITAL_COMMUNITY)
Admission: RE | Admit: 2021-03-05 | Discharge: 2021-03-05 | Disposition: A | Discharge status: Home / Self Care | Payer: 59 | Source: Ambulatory Visit | Attending: Obstetrics & Gynecology | Admitting: Obstetrics & Gynecology

## 2021-03-05 ENCOUNTER — Other Ambulatory Visit: Payer: Self-pay

## 2021-03-05 DIAGNOSIS — R87610 Atypical squamous cells of undetermined significance on cytologic smear of cervix (ASC-US): Secondary | ICD-10-CM | POA: Diagnosis not present

## 2021-03-05 NOTE — Progress Notes (Signed)
° ° °  April Cowan [birth year] [id number]        60 y.o.  G2P2   RP: ASCUS x 2, HPV HR Neg, for Colpo  HPI: ASCUS x 2, HPV HR Neg on 02/18/2021.  Never had a Colpo before.   OB History  Gravida Para Term Preterm AB Living  2 2       2   SAB IAB Ectopic Multiple Live Births               # Outcome Date GA Lbr Len/2nd Weight Sex Delivery Anes PTL Lv  2 Para           1 Para             Past medical history,surgical history, problem list, medications, allergies, family history and social history were all reviewed and documented in the EPIC chart.   Directed ROS with pertinent positives and negatives documented in the history of present illness/assessment and plan.  Exam:  Vitals:   03/05/21 1150  BP: 120/80   General appearance:  Normal  Colposcopy Procedure Note Kashari Chalmers Borrayo 03/05/2021  Indications: ASCUS x 2, HPV HR Neg, for Colpo  Procedure Details  The risks and benefits of the procedure and Written informed consent obtained.  Speculum placed in vagina and excellent visualization of cervix achieved, cervix swabbed x 3 with acetic acid solution.  Findings:  Cervix colposcopy:  Physical Exam Genitourinary:       Vaginal colposcopy: Normal  Vulvar colposcopy: Normal  Perirectal colposcopy: Normal  The cervix was sprayed with Hurricane before performing the cervical biopsies.  Specimens: Cervical Bx at 3 O'Clock  Complications: None, good hemostasis with Silver Nitrate . Plan: Management per Cervical Bx results.   Assessment/Plan:  60 y.o. G2P2   1. ASCUS of cervix with negative high risk HPV ASCUS x 2, HPV HR Neg on 02/18/2021.  Never had a Colpo before.  Counseling on abnormal Pap and Colpo done. Colpo findings reviewed.  Postprocedure precautions reviewed.  Management per results. - Colposcopy - Surgical pathology( [facility]/ [facility])   April Bruins MD, 11:55 AM 03/05/2021

## 2021-03-06 ENCOUNTER — Ambulatory Visit: Payer: 59

## 2021-03-07 LAB — SURGICAL PATHOLOGY

## 2021-03-08 ENCOUNTER — Encounter: Payer: Self-pay | Admitting: Obstetrics & Gynecology

## 2021-03-08 ENCOUNTER — Ambulatory Visit (INDEPENDENT_AMBULATORY_CARE_PROVIDER_SITE_OTHER): Payer: 59

## 2021-03-08 DIAGNOSIS — E538 Deficiency of other specified B group vitamins: Secondary | ICD-10-CM | POA: Diagnosis not present

## 2021-03-08 MED ORDER — CYANOCOBALAMIN 1000 MCG/ML IJ SOLN
1000.0000 ug | Freq: Once | INTRAMUSCULAR | Status: AC
  Administered 2021-03-08: 1000 ug via INTRAMUSCULAR

## 2021-03-08 NOTE — Progress Notes (Signed)
Per orders of Dr. Hernandez, injection of Cyanocobalamin 1000 mcg given by Emali Heyward L Leona Alen. Patient tolerated injection well.  

## 2021-05-10 ENCOUNTER — Ambulatory Visit: Payer: Self-pay

## 2021-05-10 ENCOUNTER — Ambulatory Visit (INDEPENDENT_AMBULATORY_CARE_PROVIDER_SITE_OTHER): Payer: Managed Care, Other (non HMO)

## 2021-05-10 DIAGNOSIS — E538 Deficiency of other specified B group vitamins: Secondary | ICD-10-CM | POA: Diagnosis not present

## 2021-05-10 MED ORDER — CYANOCOBALAMIN 1000 MCG/ML IJ SOLN
1000.0000 ug | INTRAMUSCULAR | Status: AC
  Administered 2021-05-10 – 2021-06-07 (×2): 1000 ug via INTRAMUSCULAR

## 2021-05-10 NOTE — Progress Notes (Signed)
Pt here for monthly B12 injection per Dr Jerilee Hoh.  B12 1045mcg given IM right deltoid and pt tolerated injection well.  Next B12 injection scheduled for 06/07/21.  Cory: please cosign since PCP is out of office.

## 2021-06-07 ENCOUNTER — Ambulatory Visit (INDEPENDENT_AMBULATORY_CARE_PROVIDER_SITE_OTHER): Payer: Managed Care, Other (non HMO)

## 2021-06-07 DIAGNOSIS — E538 Deficiency of other specified B group vitamins: Secondary | ICD-10-CM

## 2021-06-07 NOTE — Progress Notes (Signed)
Pt here for monthly B12 injection per Dr Jerilee Hoh. ? ?B12 1099mg given IM, and pt tolerated injection well. ? ?Next B12 injection scheduled on 07/08/21 during OV for CPE. ? ?Dr BElease Hashimoto please cosign since PCP is out of office. Thank you! ? ?

## 2021-06-16 ENCOUNTER — Other Ambulatory Visit: Payer: Self-pay | Admitting: Internal Medicine

## 2021-06-16 DIAGNOSIS — E785 Hyperlipidemia, unspecified: Secondary | ICD-10-CM

## 2021-06-24 ENCOUNTER — Telehealth: Payer: Self-pay

## 2021-06-24 MED ORDER — ESTRADIOL 0.1 MG/GM VA CREA: TOPICAL_CREAM | VAGINAL | 2 refills | Status: DC

## 2021-06-24 NOTE — Telephone Encounter (Signed)
Patient called back and pharmacy of her choice is [facility]. Rx cancelled at [facility] and resent to [facility]. ?

## 2021-06-24 NOTE — Telephone Encounter (Signed)
Patient received Rx for Estradiol Cream in Dec 2022 but her insurance changed with new year and she needs to change pharmacy.  ? ?I called her back to clarify which pharmacy as she mentioned [facility] or [facility] at [employer]. Left message to call back. ?

## 2021-07-08 ENCOUNTER — Encounter: Payer: Managed Care, Other (non HMO) | Admitting: Internal Medicine

## 2021-07-10 ENCOUNTER — Encounter: Payer: Self-pay | Admitting: Obstetrics & Gynecology

## 2021-07-29 ENCOUNTER — Encounter: Payer: Managed Care, Other (non HMO) | Admitting: Internal Medicine

## 2021-08-21 ENCOUNTER — Encounter: Payer: Self-pay | Admitting: Internal Medicine

## 2021-08-21 ENCOUNTER — Ambulatory Visit (INDEPENDENT_AMBULATORY_CARE_PROVIDER_SITE_OTHER): Payer: Commercial Managed Care - HMO | Admitting: Internal Medicine

## 2021-08-21 VITALS — BP 122/80 | HR 70 | Temp 97.7°F | Ht 66.5 in | Wt 156.1 lb

## 2021-08-21 DIAGNOSIS — E538 Deficiency of other specified B group vitamins: Secondary | ICD-10-CM

## 2021-08-21 DIAGNOSIS — E559 Vitamin D deficiency, unspecified: Secondary | ICD-10-CM | POA: Diagnosis not present

## 2021-08-21 DIAGNOSIS — E782 Mixed hyperlipidemia: Secondary | ICD-10-CM

## 2021-08-21 DIAGNOSIS — Z0000 Encounter for general adult medical examination without abnormal findings: Secondary | ICD-10-CM

## 2021-08-21 LAB — CBC WITH DIFFERENTIAL/PLATELET
Basophils Absolute: 0 10*3/uL (ref 0.0–0.1)
Basophils Relative: 0.9 % (ref 0.0–3.0)
Eosinophils Absolute: 0.1 10*3/uL (ref 0.0–0.7)
Eosinophils Relative: 2.1 % (ref 0.0–5.0)
HCT: 39.4 % (ref 36.0–46.0)
Hemoglobin: 13.5 g/dL (ref 12.0–15.0)
Lymphocytes Relative: 21.4 % (ref 12.0–46.0)
Lymphs Abs: 1.1 10*3/uL (ref 0.7–4.0)
MCHC: 34.3 g/dL (ref 30.0–36.0)
MCV: 93.8 fl (ref 78.0–100.0)
Monocytes Absolute: 0.6 10*3/uL (ref 0.1–1.0)
Monocytes Relative: 10.7 % (ref 3.0–12.0)
Neutro Abs: 3.3 10*3/uL (ref 1.4–7.7)
Neutrophils Relative %: 64.9 % (ref 43.0–77.0)
Platelets: 177 10*3/uL (ref 150.0–400.0)
RBC: 4.2 Mil/uL (ref 3.87–5.11)
RDW: 13.2 % (ref 11.5–15.5)
WBC: 5.2 10*3/uL (ref 4.0–10.5)

## 2021-08-21 LAB — LIPID PANEL
Cholesterol: 235 mg/dL — ABNORMAL HIGH (ref 0–200)
HDL: 106.1 mg/dL (ref >39.00)
LDL Cholesterol: 112 mg/dL — ABNORMAL HIGH (ref 0–99)
NonHDL: 129.23
Total CHOL/HDL Ratio: 2
Triglycerides: 86 mg/dL (ref 0.0–149.0)
VLDL: 17.2 mg/dL (ref 0.0–40.0)

## 2021-08-21 LAB — COMPREHENSIVE METABOLIC PANEL
ALT: 16 U/L (ref 0–35)
AST: 19 U/L (ref 0–37)
Albumin: 4.7 g/dL (ref 3.5–5.2)
Alkaline Phosphatase: 60 U/L (ref 39–117)
BUN: 21 mg/dL (ref 6–23)
CO2: 29 mEq/L (ref 19–32)
Calcium: 10 mg/dL (ref 8.4–10.5)
Chloride: 97 mEq/L (ref 96–112)
Creatinine, Ser: 0.81 mg/dL (ref 0.40–1.20)
GFR: 77.33 mL/min (ref >60.00)
Glucose, Bld: 82 mg/dL (ref 70–99)
Potassium: 4.4 mEq/L (ref 3.5–5.1)
Sodium: 135 mEq/L (ref 135–145)
Total Bilirubin: 0.7 mg/dL (ref 0.2–1.2)
Total Protein: 7.9 g/dL (ref 6.0–8.3)

## 2021-08-21 LAB — TSH: TSH: 1.87 u[IU]/mL (ref 0.35–5.50)

## 2021-08-21 LAB — HEMOGLOBIN A1C: Hgb A1c MFr Bld: 5.2 % (ref 4.6–6.5)

## 2021-08-21 LAB — VITAMIN B12: Vitamin B-12: 217 pg/mL (ref 211–911)

## 2021-08-21 LAB — VITAMIN D 25 HYDROXY (VIT D DEFICIENCY, FRACTURES): VITD: 29.34 ng/mL — ABNORMAL LOW (ref 30.00–100.00)

## 2021-08-21 NOTE — Progress Notes (Signed)
Established Patient Office Visit     CC/Reason for Visit: Annual preventive exam, yearly follow-up chronic medical conditions  HPI: April Cowan is a 61 y.o. female who is coming in today for the above mentioned reasons. Past Medical History is significant for: Hyperlipidemia, vitamin D and vitamin B12 deficiencies.  She has also had atypical cells of undetermined significance on 2 consecutive Pap smears.  Is due to follow-up with her GYN again soon.  She is otherwise feeling well and has no acute concerns or complaints.  She is overdue for an eye exam but has routine dental care.  She exercises by walking on a daily basis.  All immunizations and cancer screenings are up-to-date.   Past Medical/Surgical History: Past Medical History:  Diagnosis Date   Abnormal Pap smear of cervix    Anemia    taking B 12 injections   Hyperlipidemia    Spider veins    Vaginal atrophy     Past Surgical History:  Procedure Laterality Date   CERVICAL BIOPSY  W/ LOOP ELECTRODE EXCISION     COLPOSCOPY      Social History:  reports that she has never smoked. She has never used smokeless tobacco. She reports current alcohol use. She reports that she does not use drugs.  Allergies: Allergies  Allergen Reactions   Sulfa Antibiotics     Family History:  Family History  Problem Relation Age of Onset   Arthritis Paternal Grandmother    Colon cancer Paternal Grandmother    Breast cancer Paternal Grandmother    Lung cancer Paternal Grandfather    Hyperlipidemia Mother    Heart disease Father    Hypertension Father    Atrial fibrillation Father    Colon polyps Father    Esophageal cancer Neg Hx    Stomach cancer Neg Hx    Rectal cancer Neg Hx      Current Outpatient Medications:    atorvastatin (LIPITOR) 20 MG tablet, TAKE ONE TABLET BY MOUTH DAILY, Disp: 90 tablet, Rfl: 1   estradiol (ESTRACE) 0.1 MG/GM vaginal cream, Place one gram intravaginally twice weekly., Disp: 42.5 g,  Rfl: 2   VITAMIN D PO, Take by mouth., Disp: , Rfl:    Cyanocobalamin (B-12 COMPLIANCE INJECTION IJ), Inject as directed. (Patient not taking: Reported on 08/21/2021), Disp: , Rfl:   Review of Systems:  Constitutional: Denies fever, chills, diaphoresis, appetite change and fatigue.  HEENT: Denies photophobia, eye pain, redness, hearing loss, ear pain, congestion, sore throat, rhinorrhea, sneezing, mouth sores, trouble swallowing, neck pain, neck stiffness and tinnitus.   Respiratory: Denies SOB, DOE, cough, chest tightness,  and wheezing.   Cardiovascular: Denies chest pain, palpitations and leg swelling.  Gastrointestinal: Denies nausea, vomiting, abdominal pain, diarrhea, constipation, blood in stool and abdominal distention.  Genitourinary: Denies dysuria, urgency, frequency, hematuria, flank pain and difficulty urinating.  Endocrine: Denies: hot or cold intolerance, sweats, changes in hair or nails, polyuria, polydipsia. Musculoskeletal: Denies myalgias, back pain, joint swelling, arthralgias and gait problem.  Skin: Denies pallor, rash and wound.  Neurological: Denies dizziness, seizures, syncope, weakness, light-headedness, numbness and headaches.  Hematological: Denies adenopathy. Easy bruising, personal or family bleeding history  Psychiatric/Behavioral: Denies suicidal ideation, mood changes, confusion, nervousness, sleep disturbance and agitation    Physical Exam: Vitals:   08/21/21 1308  BP: 122/80  Pulse: 70  Temp: 97.7 F (36.5 C)  TempSrc: Oral  SpO2: 98%  Weight: 156 lb 1.6 oz (70.8 kg)  Height: 5' 6.5" (1.689  m)    Body mass index is 24.82 kg/m.   Constitutional: NAD, calm, comfortable Eyes: PERRL, lids and conjunctivae normal ENMT: Mucous membranes are moist. Posterior pharynx clear of any exudate or lesions. Normal dentition. Tympanic membrane is pearly white, no erythema or bulging. Neck: normal, supple, no masses, no thyromegaly Respiratory: clear to  auscultation bilaterally, no wheezing, no crackles. Normal respiratory effort. No accessory muscle use.  Cardiovascular: Regular rate and rhythm, no murmurs / rubs / gallops. No extremity edema. 2+ pedal pulses. No carotid bruits.  Abdomen: no tenderness, no masses palpated. No hepatosplenomegaly. Bowel sounds positive.  Musculoskeletal: no clubbing / cyanosis. No joint deformity upper and lower extremities. Good ROM, no contractures. Normal muscle tone.  Skin: no rashes, lesions, ulcers. No induration Neurologic: CN 2-12 grossly intact. Sensation intact, DTR normal. Strength 5/5 in all 4.  Psychiatric: Normal judgment and insight. Alert and oriented x 3. Normal mood.    Impression and Plan:  Encounter for preventive health examination  - Plan: CBC with Differential/Platelet, Comprehensive metabolic panel, Hemoglobin A1c, TSH -Recommend routine eye and dental care. -Immunizations: All immunizations are up-to-date -Healthy lifestyle discussed in detail. -Labs to be updated today. -Colon cancer screening: 07/2020 -Breast cancer screening: 09/2020 -Cervical cancer screening: 01/2021 -Lung cancer screening: Never smoker, not applicable -Prostate cancer screening: Not applicable -DEXA: 09/9478, resume age 61  Mixed hyperlipidemia  - Plan: Lipid panel -On atorvastatin 20 mg daily.  Vitamin B12 deficiency  - Plan: Vitamin B12  Vitamin D deficiency  - Plan: VITAMIN D 25 Hydroxy (Vit-D Deficiency, Fractures)     Beatris Belen Isaac Bliss, MD [facility] at [facility]

## 2021-08-28 ENCOUNTER — Other Ambulatory Visit: Payer: Self-pay | Admitting: Internal Medicine

## 2021-08-28 DIAGNOSIS — E559 Vitamin D deficiency, unspecified: Secondary | ICD-10-CM

## 2021-08-28 MED ORDER — VITAMIN D (ERGOCALCIFEROL) 1.25 MG (50000 UNIT) PO CAPS: 50000.0000 [IU] | ORAL_CAPSULE | ORAL | 0 refills | Status: AC

## 2021-08-29 ENCOUNTER — Other Ambulatory Visit: Payer: Self-pay | Admitting: *Deleted

## 2021-08-29 DIAGNOSIS — E559 Vitamin D deficiency, unspecified: Secondary | ICD-10-CM

## 2021-09-02 ENCOUNTER — Ambulatory Visit (INDEPENDENT_AMBULATORY_CARE_PROVIDER_SITE_OTHER): Payer: Commercial Managed Care - HMO

## 2021-09-02 DIAGNOSIS — E538 Deficiency of other specified B group vitamins: Secondary | ICD-10-CM

## 2021-09-02 MED ORDER — CYANOCOBALAMIN 1000 MCG/ML IJ SOLN
1000.0000 ug | INTRAMUSCULAR | Status: DC
  Administered 2021-09-02: 1000 ug via INTRAMUSCULAR

## 2021-09-02 NOTE — Progress Notes (Signed)
Pt here for monthly B12 injection per Dr Jerilee Hoh.  B12 1042mg given IM left deltoid and pt tolerated injection well.  Next B12 injection scheduled for 10/02/21.

## 2021-09-05 ENCOUNTER — Encounter: Payer: Self-pay | Admitting: Obstetrics & Gynecology

## 2021-09-05 ENCOUNTER — Other Ambulatory Visit (HOSPITAL_COMMUNITY)
Admission: RE | Admit: 2021-09-05 | Discharge: 2021-09-05 | Disposition: A | Discharge status: Home / Self Care | Payer: Commercial Managed Care - HMO | Source: Ambulatory Visit | Attending: Obstetrics & Gynecology | Admitting: Obstetrics & Gynecology

## 2021-09-05 ENCOUNTER — Ambulatory Visit (INDEPENDENT_AMBULATORY_CARE_PROVIDER_SITE_OTHER): Payer: Commercial Managed Care - HMO | Admitting: Obstetrics & Gynecology

## 2021-09-05 VITALS — BP 104/70

## 2021-09-05 DIAGNOSIS — R87610 Atypical squamous cells of undetermined significance on cytologic smear of cervix (ASC-US): Secondary | ICD-10-CM | POA: Diagnosis not present

## 2021-09-05 DIAGNOSIS — N870 Mild cervical dysplasia: Secondary | ICD-10-CM | POA: Diagnosis not present

## 2021-09-05 NOTE — Progress Notes (Signed)
    April Cowan [birth year] [id number]        61 y.o.  G2P2L2   RP: H/O CIN 1 for 6 months repeat Pap  HPI: ASCUS x 2, HPV HR Neg on 02/18/2021.  Colpo 02/2021 CIN 1.   OB History  Gravida Para Term Preterm AB Living  '2 2 2     2  '$ SAB IAB Ectopic Multiple Live Births               # Outcome Date GA Lbr Len/2nd Weight Sex Delivery Anes PTL Lv  2 Term           1 Term             Past medical history,surgical history, problem list, medications, allergies, family history and social history were all reviewed and documented in the EPIC chart.   Directed ROS with pertinent positives and negatives documented in the history of present illness/assessment and plan.  Exam:  Vitals:   09/05/21 1352  BP: 104/70   General appearance:  Normal  Gynecologic exam: Vulva normal.  Speculum:  Cervix/Vagina normal.  Pap/HPV HR done.   Assessment/Plan:  61 y.o. G2P2002   1. ASCUS of cervix with negative high risk HPV  ASCUS x 2, HPV HR Neg on 02/18/2021.  Colpo 02/2021 CIN 1.  Pap/HPV HR done today.  Management per results. - Cytology - PAP( [name])  2. Mild dysplasia of cervix Co;lpo 02/2021 Mild dysplasia, CIN 1. - Cytology - PAP( [name])   [name] MD, 2:21 PM 09/05/2021

## 2021-09-09 LAB — CYTOLOGY - PAP
Comment: NEGATIVE
Diagnosis: UNDETERMINED — AB
High risk HPV: NEGATIVE

## 2021-09-26 ENCOUNTER — Other Ambulatory Visit: Payer: Self-pay | Admitting: Obstetrics & Gynecology

## 2021-09-26 DIAGNOSIS — Z1231 Encounter for screening mammogram for malignant neoplasm of breast: Secondary | ICD-10-CM

## 2021-10-02 ENCOUNTER — Ambulatory Visit: Payer: Commercial Managed Care - HMO

## 2021-10-09 ENCOUNTER — Ambulatory Visit (INDEPENDENT_AMBULATORY_CARE_PROVIDER_SITE_OTHER): Payer: Commercial Managed Care - HMO

## 2021-10-09 DIAGNOSIS — E538 Deficiency of other specified B group vitamins: Secondary | ICD-10-CM | POA: Diagnosis not present

## 2021-10-09 MED ORDER — CYANOCOBALAMIN 1000 MCG/ML IJ SOLN
1000.0000 ug | Freq: Once | INTRAMUSCULAR | Status: AC
  Administered 2021-10-09: 1000 ug via INTRAMUSCULAR

## 2021-10-09 NOTE — Progress Notes (Signed)
Per orders of Dr. Jerilee Hoh, injection of VitB 12 1,04mg given by KEncarnacion Slateson Right Deltoid.  Patient tolerated injection well.

## 2021-10-21 ENCOUNTER — Ambulatory Visit
Admission: RE | Admit: 2021-10-21 | Discharge: 2021-10-21 | Disposition: A | Discharge status: Home / Self Care | Payer: Commercial Managed Care - HMO | Source: Ambulatory Visit | Attending: Obstetrics & Gynecology | Admitting: Obstetrics & Gynecology

## 2021-10-21 DIAGNOSIS — Z1231 Encounter for screening mammogram for malignant neoplasm of breast: Secondary | ICD-10-CM

## 2021-10-29 ENCOUNTER — Encounter: Payer: Self-pay | Admitting: Internal Medicine

## 2021-10-29 ENCOUNTER — Ambulatory Visit: Payer: Commercial Managed Care - HMO | Admitting: Internal Medicine

## 2021-10-29 VITALS — BP 130/80 | HR 65 | Temp 97.7°F | Wt 161.9 lb

## 2021-10-29 DIAGNOSIS — M545 Low back pain, unspecified: Secondary | ICD-10-CM | POA: Diagnosis not present

## 2021-10-29 DIAGNOSIS — G8929 Other chronic pain: Secondary | ICD-10-CM

## 2021-10-29 NOTE — Progress Notes (Signed)
Established Patient Office Visit     CC/Reason for Visit: Discuss back pain  HPI: April Cowan is a 61 y.o. female who is coming in today for the above mentioned reasons.  She has had chronic midline lower back pain for years.  She feels like it started in college age.  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  There has been a lot of air travel and heavy lifting and she feels this has aggravated the back pain.  She has no bowel or bladder incontinence, no saddle anesthesia, no radiation.  Past Medical/Surgical History: Past Medical History:  Diagnosis Date   Abnormal Pap smear of cervix    Anemia    taking B 12 injections   Hyperlipidemia    Spider veins    Vaginal atrophy     Past Surgical History:  Procedure Laterality Date   CERVICAL BIOPSY  W/ LOOP ELECTRODE EXCISION     COLPOSCOPY      Social History:  reports that she has never smoked. She has never used smokeless tobacco. She reports current alcohol use. She reports that she does not use drugs.  Allergies: Allergies  Allergen Reactions   Sulfa Antibiotics     Family History:  Family History  Problem Relation Age of Onset   Arthritis Paternal Grandmother    Colon cancer Paternal Grandmother    Breast cancer Paternal Grandmother    Lung cancer Paternal Grandfather    Hyperlipidemia Mother    Heart disease Father    Hypertension Father    Atrial fibrillation Father    Colon polyps Father    Esophageal cancer Neg Hx    Stomach cancer Neg Hx    Rectal cancer Neg Hx      Current Outpatient Medications:    atorvastatin (LIPITOR) 20 MG tablet, TAKE ONE TABLET BY MOUTH DAILY, Disp: 90 tablet, Rfl: 1   Cyanocobalamin (B-12 COMPLIANCE INJECTION IJ), Inject as directed., Disp: , Rfl:    estradiol (ESTRACE) 0.1 MG/GM vaginal cream, Place one gram intravaginally twice weekly., Disp: 42.5 g, Rfl: 2   Vitamin D, Ergocalciferol,  (DRISDOL) 1.25 MG (50000 UNIT) CAPS capsule, Take 1 capsule (50,000 Units total) by mouth every 7 (seven) days for 12 doses., Disp: 12 capsule, Rfl: 0  Review of Systems:  Constitutional: Denies fever, chills, diaphoresis, appetite change and fatigue.  HEENT: Denies photophobia, eye pain, redness, hearing loss, ear pain, congestion, sore throat, rhinorrhea, sneezing, mouth sores, trouble swallowing, neck pain, neck stiffness and tinnitus.   Respiratory: Denies SOB, DOE, cough, chest tightness,  and wheezing.   Cardiovascular: Denies chest pain, palpitations and leg swelling.  Gastrointestinal: Denies nausea, vomiting, abdominal pain, diarrhea, constipation, blood in stool and abdominal distention.  Genitourinary: Denies dysuria, urgency, frequency, hematuria, flank pain and difficulty urinating.  Endocrine: Denies: hot or cold intolerance, sweats, changes in hair or nails, polyuria, polydipsia. Musculoskeletal: Denies myalgias,  joint swelling, arthralgias and gait problem.  Skin: Denies pallor, rash and wound.  Neurological: Denies dizziness, seizures, syncope, weakness, light-headedness, numbness and headaches.  Hematological: Denies adenopathy. Easy bruising, personal or family bleeding history  Psychiatric/Behavioral: Denies suicidal ideation, mood changes, confusion, nervousness, sleep disturbance and agitation    Physical Exam: Vitals:   10/29/21 1426  BP: 130/80  Pulse: 65  Temp: 97.7 F (36.5 C)  TempSrc: Oral  SpO2: 98%  Weight: 161 lb 14.4 oz (73.4 kg)    Body  mass index is 25.74 kg/m.   Constitutional: NAD, calm, comfortable Eyes: PERRL, lids and conjunctivae normal ENMT: Mucous membranes are moist.  Neurologic: CN 2-12 grossly intact. Sensation intact, DTR normal. Strength 5/5 in all 4.  Psychiatric: Normal judgment and insight. Alert and oriented x 3. Normal mood.    Impression and Plan:  Chronic midline low back pain without sciatica  - Plan: Ambulatory  referral to Physical Therapy -We have discussed as needed NSAIDs, icing, stretches, local massage therapy, I will also place PT referral.    Time spent:23 minutes reviewing chart, interviewing and examining patient and formulating plan of care.    Lelon Frohlich, MD [facility] at [facility]

## 2021-11-05 NOTE — Therapy (Signed)
OUTPATIENT PHYSICAL THERAPY THORACOLUMBAR EVALUATION   Patient Name: April Cowan MRN: [id number] DOB:Dec 22, 1960, 61 y.o., female Today's Date: 11/08/2021   PT End of Session - 11/08/21 1147     Visit Number 1    Number of Visits 16    Authorization Type [employer] [place] [employer]    PT Start Time [phone]    PT Stop Time 0940    PT Time Calculation (min) 55 min    Activity Tolerance Patient tolerated treatment well    Behavior During Therapy WFL for tasks assessed/performed             Past Medical History:  Diagnosis Date   Abnormal Pap smear of cervix    Anemia    taking B 12 injections   Hyperlipidemia    Spider veins    Vaginal atrophy    Past Surgical History:  Procedure Laterality Date   CERVICAL BIOPSY  W/ LOOP ELECTRODE EXCISION     COLPOSCOPY     Patient Active Problem List   Diagnosis Date Noted   Vitamin D deficiency 06/22/2020   Vitamin B12 deficiency 06/22/2020   Hyperlipidemia 06/22/2020    PCP: Isaac Bliss, Rayford Halsted, MD  REFERRING PROVIDER: Isaac Bliss, Rayford Halsted, MD  REFERRING DIAG: M54.50,G89.29 (ICD-10-CM) - Chronic midline low back pain without sciatica  Rationale for Evaluation and Treatment Rehabilitation  THERAPY DIAG:  Other low back pain  Muscle weakness (generalized)  ONSET DATE: Over the last year.   SUBJECTIVE:                                                                                                                                                                                           SUBJECTIVE STATEMENT: 61 year old female presents to PT with reports of lower back pain. She states that it started about a year ago, but has gotten progressively worse over time. Pt reports increased stiffness noted upon waking that subsides throughout the day. She is retired, but actively walks 2 miles a day. Pt reports pain being more of a compression type feeling, with intermittent stabbing with certain movements. She has the  most difficulty getting in/ out of a seated position. 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 She is getting guidance from her parents PT's for proper body mechanics, but notes a lot of weakness in her core.  PERTINENT HISTORY:  Anemia  PAIN:  Are you having pain? Yes: NPRS scale: 2/3 /10 Pain location: Lumbar spine L3-L5. Pain description: Intermittent sharp pain, achy. Aggravating factors: Walking up stairs, squatting, getting up  from a seated position.  Relieving factors: Motrin    PRECAUTIONS: None  WEIGHT BEARING RESTRICTIONS No  FALLS:  Has patient fallen in last 6 months? No  LIVING ENVIRONMENT: Lives with: lives with their spouse Lives in: House/apartment Stairs: Yes: External: 4 steps; on right going up Has following equipment at home: None  OCCUPATION: Retired.  PLOF: Independent  PATIENT GOALS Pt would like to strengthen core.  OBJECTIVE:  DIAGNOSTIC FINDINGS:  None  PATIENT SURVEYS:  FOTO 67.0400,   SCREENING FOR RED FLAGS: Bowel or bladder incontinence: No Spinal tumors: No Cauda equina syndrome: No Compression fracture: No Abdominal aneurysm: No  COGNITION:  Overall cognitive status: Within functional limits for tasks assessed     SENSATION: WFL  MUSCLE LENGTH: Hamstrings: Right 60 deg; Left 62 deg  POSTURE: No Significant postural limitations  PALPATION: No pain with palpation. Pain noted with axial compression.   LUMBAR ROM:   Active  A/PROM  eval  Flexion 66  Extension 8  Right lateral flexion Too knee joint  Left lateral flexion Too knee joint with reported pull.   Right rotation WFL  Left rotation WFL   (Blank rows = not tested)  LOWER EXTREMITY ROM:     Active  Right eval Left eval  Hip flexion [facility]  Hip extension    Hip abduction    Hip adduction    Hip internal rotation [facility]  Hip external rotation [facility]  Knee flexion [facility] WFL  Knee  extension WFL WFL   (Blank rows = not tested)  LOWER EXTREMITY MMT:    MMT Right eval Left eval  Hip flexion 4- 4-  Hip extension    Hip abduction 5 5  Hip adduction    Hip internal rotation    Hip external rotation    Knee flexion 5 5  Knee extension 5 5   (Blank rows = not tested)  LUMBAR SPECIAL TESTS:  Straight leg raise test: Positive and Axial loading: positive   FUNCTIONAL TESTS:  NONE  GAIT: Distance walked: 40f  Assistive device utilized: None Level of assistance: Complete Independence Comments: No antalgic gait pattern noted.   TODAY'S TREATMENT  Creating, reviewing, and completing below HEP. Moist heat applied to lower back at end of session today.   PATIENT EDUCATION:  Education details: Educated pt on anatomy and physiology of current symptoms, FOTO, diagnosis, prognosis, HEP,  and POC. Person educated: Patient Education method: ECustomer service managerEducation comprehension: verbalized understanding   HOME EXERCISE PROGRAM: Access Code: [email or web address]: [email or web address] Date: 11/08/2021 Prepared by: SRudi Heap Exercises - Supine Posterior Pelvic Tilt  - 2 x daily - 7 x weekly - 2 sets - 10 reps - Seated Cat Cow  - 2 x daily - 7 x weekly - 2 sets - 10 reps - 2 hold - Supine Hamstring Stretch with Strap  - 2 x daily - 7 x weekly - 2 sets - 2 reps - 30 hold  ASSESSMENT:  CLINICAL IMPRESSION: Patient is a 61y.o. F who was seen today for physical therapy evaluation and treatment for Chronic midline low back pain without sciatica. Pt only reports pain with axial loading. She is very eager to strengthen her core to help minimize her back pain. She is actively taking care of her elderly parents requiring her to fly back and forth. Pt states that she has been educated on proper body mechanics, but notes pain after she leaves. Mostly stiffness noted upon waking, but eases  up throughout the day. She is active with walking everyday  to help minimize familiar symptoms.     OBJECTIVE IMPAIRMENTS decreased endurance, decreased mobility, decreased ROM, decreased strength, improper body mechanics, and pain.   ACTIVITY LIMITATIONS carrying, sitting, squatting, and caring for others  PARTICIPATION LIMITATIONS: laundry and driving  PERSONAL FACTORS Behavior pattern, Past/current experiences, Time since onset of injury/illness/exacerbation, and 1 comorbidity: Anemia  are also affecting patient's functional outcome.   REHAB POTENTIAL: Good  CLINICAL DECISION MAKING: Stable/uncomplicated  EVALUATION COMPLEXITY: Low   GOALS: Goals reviewed with patient? No  SHORT TERM GOALS: Target date: 12/06/2021  Pt will be I and compliant with initial HEP. Baseline: provided at eval Goal status: INITIAL   LONG TERM GOALS: Target date: 01/03/2022  Pt will be independent with advanced HEP to continue to address postural limitations and muscle imbalances. Baseline: not provided Goal status: INITIAL  3.  Pt will improve FOTO function score to no less than 75% as proxy for functional improvement Baseline:  Goal status: INITIAL  4.  Pt will report </=210 pain at baseline  Baseline:  Goal status: INITIAL  5.  Pt will improve hip flexion Mmt to 5/5 BIL Baseline: 4- Goal status: INITIAL  6.  Pt will improve HS flexibility to WNL. Baseline:  Goal status: INITIAL   PLAN: PT FREQUENCY: 1-2x/week  PT DURATION: 8 weeks  PLANNED INTERVENTIONS: Therapeutic exercises, Therapeutic activity, Neuromuscular re-education, Balance training, Gait training, Patient/Family education, Self Care, Joint mobilization, Joint manipulation, Stair training, Aquatic Therapy, Dry Needling, Electrical stimulation, Spinal manipulation, Spinal mobilization, Cryotherapy, Moist heat, Vasopneumatic device, Traction, Ultrasound, Ionotophoresis '4mg'$ /ml Dexamethasone, Manual therapy, and Re-evaluation.  PLAN FOR NEXT SESSION: Assess HEP/update PRN, continue  to progress functional mobility, strengthen core and proximal hip muscles. Decrease patients pain and help minimize functional deficits. Review body mechanics with lifting suitcases, and getting in/ out of chair.    Lynden Ang, PT 11/08/2021, 11:49 AM

## 2021-11-08 ENCOUNTER — Encounter: Payer: Self-pay | Admitting: Physical Therapy

## 2021-11-08 ENCOUNTER — Ambulatory Visit: Payer: Commercial Managed Care - HMO | Attending: Internal Medicine | Admitting: Physical Therapy

## 2021-11-08 ENCOUNTER — Other Ambulatory Visit: Payer: Self-pay

## 2021-11-08 DIAGNOSIS — G8929 Other chronic pain: Secondary | ICD-10-CM | POA: Diagnosis not present

## 2021-11-08 DIAGNOSIS — M545 Low back pain, unspecified: Secondary | ICD-10-CM | POA: Diagnosis not present

## 2021-11-08 DIAGNOSIS — M6281 Muscle weakness (generalized): Secondary | ICD-10-CM | POA: Insufficient documentation

## 2021-11-08 DIAGNOSIS — M5459 Other low back pain: Secondary | ICD-10-CM | POA: Diagnosis present

## 2021-11-15 ENCOUNTER — Encounter: Payer: Self-pay | Admitting: Physical Therapy

## 2021-11-15 ENCOUNTER — Ambulatory Visit (INDEPENDENT_AMBULATORY_CARE_PROVIDER_SITE_OTHER): Payer: Commercial Managed Care - HMO

## 2021-11-15 ENCOUNTER — Ambulatory Visit: Payer: Commercial Managed Care - HMO | Admitting: Physical Therapy

## 2021-11-15 DIAGNOSIS — M6281 Muscle weakness (generalized): Secondary | ICD-10-CM

## 2021-11-15 DIAGNOSIS — M5459 Other low back pain: Secondary | ICD-10-CM | POA: Diagnosis not present

## 2021-11-15 DIAGNOSIS — E538 Deficiency of other specified B group vitamins: Secondary | ICD-10-CM | POA: Diagnosis not present

## 2021-11-15 MED ORDER — CYANOCOBALAMIN 1000 MCG/ML IJ SOLN
1000.0000 ug | Freq: Once | INTRAMUSCULAR | Status: AC
  Administered 2021-11-15: 1000 ug via INTRAMUSCULAR

## 2021-11-15 NOTE — Therapy (Signed)
OUTPATIENT PHYSICAL THERAPY TREATMENT NOTE   Patient Name: April Cowan MRN: [id number] DOB:07-21-1960, 61 y.o., female Today's Date: 11/15/2021  PCP: Isaac Bliss, Rayford Halsted, MD REFERRING PROVIDER: Isaac Bliss, Rayford Halsted, MD  END OF SESSION:   PT End of Session - 11/15/21 0800     Visit Number 2    Number of Visits 16    Authorization Type [employer] [place] [employer]    PT Start Time 0800    PT Stop Time 0845    PT Time Calculation (min) 45 min    Activity Tolerance Patient tolerated treatment well    Behavior During Therapy WFL for tasks assessed/performed             Past Medical History:  Diagnosis Date   Abnormal Pap smear of cervix    Anemia    taking B 12 injections   Hyperlipidemia    Spider veins    Vaginal atrophy    Past Surgical History:  Procedure Laterality Date   CERVICAL BIOPSY  W/ LOOP ELECTRODE EXCISION     COLPOSCOPY     Patient Active Problem List   Diagnosis Date Noted   Vitamin D deficiency 06/22/2020   Vitamin B12 deficiency 06/22/2020   Hyperlipidemia 06/22/2020    REFERRING DIAG: M54.50,G89.29 (ICD-10-CM) - Chronic midline low back pain   THERAPY DIAG:  Other low back pain  Muscle weakness (generalized)  Rationale for Evaluation and Treatment Rehabilitation  PERTINENT HISTORY: Anemia  PRECAUTIONS: none  SUBJECTIVE: The initial stretches help a lot.   PAIN:  Are you having pain? No   OBJECTIVE: (objective measures completed at initial evaluation unless otherwise dated)  DIAGNOSTIC FINDINGS:  None   PATIENT SURVEYS:  FOTO 67.0400,    SCREENING FOR RED FLAGS: Bowel or bladder incontinence: No Spinal tumors: No Cauda equina syndrome: No Compression fracture: No Abdominal aneurysm: No   COGNITION:           Overall cognitive status: Within functional limits for tasks assessed                          SENSATION: WFL   MUSCLE LENGTH: Hamstrings: Right 60 deg; Left 62 deg   POSTURE: No Significant  postural limitations   PALPATION: No pain with palpation. Pain noted with axial compression.    LUMBAR ROM:    Active  A/PROM  eval  Flexion 66  Extension 8  Right lateral flexion Too knee joint  Left lateral flexion Too knee joint with reported pull.   Right rotation WFL  Left rotation WFL   (Blank rows = not tested)   LOWER EXTREMITY ROM:      Active  Right eval Left eval  Hip flexion [facility]  Hip extension      Hip abduction      Hip adduction      Hip internal rotation [facility]  Hip external rotation [facility]  Knee flexion [facility] WFL  Knee extension WFL WFL   (Blank rows = not tested)   LOWER EXTREMITY MMT:     MMT Right eval Left eval  Hip flexion 4- 4-  Hip extension      Hip abduction 5 5  Hip adduction      Hip internal rotation      Hip external rotation      Knee flexion 5 5  Knee extension 5 5   (Blank rows = not tested)   LUMBAR SPECIAL TESTS:  Straight leg raise test: Positive and Axial loading: positive    FUNCTIONAL TESTS:  NONE   GAIT: Distance walked: 44f  Assistive device utilized: None Level of assistance: Complete Independence Comments: No antalgic gait pattern noted.    TODAY'S TREATMENT   11/15/21: Nustep L4 6 min with PTA present to discuss current status. Review of initial HEP:Pt has difficulty flexing lumbar in seated cat cow. Changed to quadruped and did better with lumbar ROM so she will continue with that. Independent in all others. Supine TA with Pilates breath 10x: added to HEP Supine TA with Bil  clamshell. 10x : added to HEP Supine articulating bridge 10x for HEP Verbal ed on how to stand groundig through her feet more to engage her glutes better in standing.     Creating, reviewing, and completing below HEP. Moist heat applied to lower back at end of session today.     PATIENT EDUCATION:  Education details: Educated pt on anatomy and physiology of current symptoms, FOTO, diagnosis, prognosis, HEP,  and POC. Person  educated: Patient Education method: ECustomer service managerEducation comprehension: verbalized understanding     HOME EXERCISE PROGRAM: Access Code: [email or web address]: [email or web address] Date: 11/08/2021 Prepared by: SRudi Heap  Exercises - Supine Posterior Pelvic Tilt  - 2 x daily - 7 x weekly - 2 sets - 10 reps - Seated Cat Cow  - 2 x daily - 7 x weekly - 2 sets - 10 reps - 2 hold - Supine Hamstring Stretch with Strap  - 2 x daily - 7 x weekly - 2 sets - 2 reps - 30 hold 11/15/21: Added Quadruped cat/camel, supine TA, TA with clamshell, and articulating bridge.     ASSESSMENT:   CLINICAL IMPRESSION: Pt arrives with no pain. Pt is independent and compliant in initial HEP. HEp was progressed today. Pt has interest in Pilates so all exercises are introduced with a Pilates bias. Pt did not have any pain with new exercises.      OBJECTIVE IMPAIRMENTS decreased endurance, decreased mobility, decreased ROM, decreased strength, improper body mechanics, and pain.    ACTIVITY LIMITATIONS carrying, sitting, squatting, and caring for others   PARTICIPATION LIMITATIONS: laundry and driving   PERSONAL FACTORS Behavior pattern, Past/current experiences, Time since onset of injury/illness/exacerbation, and 1 comorbidity: Anemia  are also affecting patient's functional outcome.    REHAB POTENTIAL: Good   CLINICAL DECISION MAKING: Stable/uncomplicated   EVALUATION COMPLEXITY: Low     GOALS: Goals reviewed with patient? No   SHORT TERM GOALS: Target date: 12/06/2021   Pt will be I and compliant with initial HEP. Baseline: provided at eval Goal status: Met 11/15/21    LONG TERM GOALS: Target date: 01/03/2022   Pt will be independent with advanced HEP to continue to address postural limitations and muscle imbalances. Baseline: not provided Goal status: INITIAL   3.  Pt will improve FOTO function score to no less than 75% as proxy for functional  improvement Baseline:  Goal status: INITIAL   4.  Pt will report </=210 pain at baseline  Baseline:  Goal status: INITIAL   5.  Pt will improve hip flexion Mmt to 5/5 BIL Baseline: 4- Goal status: INITIAL   6.  Pt will improve HS flexibility to WNL. Baseline:  Goal status: INITIAL     PLAN: PT FREQUENCY: 1-2x/week   PT DURATION: 8 weeks   PLANNED INTERVENTIONS: Therapeutic exercises, Therapeutic activity, Neuromuscular re-education, Balance training, Gait training, Patient/Family education, Self Care, Joint mobilization,  Joint manipulation, Stair training, Aquatic Therapy, Dry Needling, Electrical stimulation, Spinal manipulation, Spinal mobilization, Cryotherapy, Moist heat, Vasopneumatic device, Traction, Ultrasound, Ionotophoresis 75m/ml Dexamethasone, Manual therapy, and Re-evaluation.   PLAN FOR NEXT SESSION: Review new exercises given today and progress      Diantha Paxson, PTA 11/15/2021, 8:47 AM

## 2021-11-15 NOTE — Progress Notes (Signed)
Pt here for monthly B12 injection per Dr Jerilee Hoh  B12 1022mg given IM Left Deltoid, and pt tolerated injection well.  Next B12 injection scheduled for 12/10/21.

## 2021-11-18 ENCOUNTER — Encounter: Payer: Self-pay | Admitting: Physical Therapy

## 2021-11-18 ENCOUNTER — Ambulatory Visit: Payer: Commercial Managed Care - HMO | Admitting: Physical Therapy

## 2021-11-18 ENCOUNTER — Ambulatory Visit (INDEPENDENT_AMBULATORY_CARE_PROVIDER_SITE_OTHER): Payer: Commercial Managed Care - HMO | Admitting: Internal Medicine

## 2021-11-18 VITALS — BP 120/80 | HR 59 | Temp 98.2°F | Wt 163.0 lb

## 2021-11-18 DIAGNOSIS — K649 Unspecified hemorrhoids: Secondary | ICD-10-CM

## 2021-11-18 DIAGNOSIS — M6281 Muscle weakness (generalized): Secondary | ICD-10-CM

## 2021-11-18 DIAGNOSIS — M5459 Other low back pain: Secondary | ICD-10-CM

## 2021-11-18 MED ORDER — HYDROCORTISONE (PERIANAL) 2.5 % EX CREA: 1.0000 | TOPICAL_CREAM | Freq: Two times a day (BID) | CUTANEOUS | 2 refills | Status: DC

## 2021-11-18 NOTE — Therapy (Signed)
OUTPATIENT PHYSICAL THERAPY TREATMENT NOTE   Patient Name: April Cowan MRN: [id number] DOB:1960/06/13, 61 y.o., female Today's Date: 11/18/2021  PCP: Isaac Bliss, Rayford Halsted, MD REFERRING PROVIDER: Isaac Bliss, Rayford Halsted, MD  END OF SESSION:   PT End of Session - 11/18/21 9702     Visit Number 3    Number of Visits 16    Authorization Type [employer] [place] [employer]    PT Start Time [phone]    PT Stop Time 0930    PT Time Calculation (min) 40 min    Activity Tolerance Patient tolerated treatment well    Behavior During Therapy WFL for tasks assessed/performed              Past Medical History:  Diagnosis Date   Abnormal Pap smear of cervix    Anemia    taking B 12 injections   Hyperlipidemia    Spider veins    Vaginal atrophy    Past Surgical History:  Procedure Laterality Date   CERVICAL BIOPSY  W/ LOOP ELECTRODE EXCISION     COLPOSCOPY     Patient Active Problem List   Diagnosis Date Noted   Vitamin D deficiency 06/22/2020   Vitamin B12 deficiency 06/22/2020   Hyperlipidemia 06/22/2020    REFERRING DIAG: M54.50,G89.29 (ICD-10-CM) - Chronic midline low back pain   THERAPY DIAG:  Other low back pain  Muscle weakness (generalized)  Rationale for Evaluation and Treatment Rehabilitation  PERTINENT HISTORY: Anemia  PRECAUTIONS: none  SUBJECTIVE: I am enjoying my exercises a lot, but I have questions.   PAIN:  Are you having pain? Dull low back pain currently. Not sure why. Exercises seem to help.    OBJECTIVE: (objective measures completed at initial evaluation unless otherwise dated)  DIAGNOSTIC FINDINGS:  None   PATIENT SURVEYS:  FOTO 67.0400,    SCREENING FOR RED FLAGS: Bowel or bladder incontinence: No Spinal tumors: No Cauda equina syndrome: No Compression fracture: No Abdominal aneurysm: No   COGNITION:           Overall cognitive status: Within functional limits for tasks assessed                          SENSATION: WFL    MUSCLE LENGTH: Hamstrings: Right 60 deg; Left 62 deg   POSTURE: No Significant postural limitations   PALPATION: No pain with palpation. Pain noted with axial compression.    LUMBAR ROM:    Active  A/PROM  eval  Flexion 66  Extension 8  Right lateral flexion Too knee joint  Left lateral flexion Too knee joint with reported pull.   Right rotation WFL  Left rotation WFL   (Blank rows = not tested)   LOWER EXTREMITY ROM:      Active  Right eval Left eval  Hip flexion [facility]  Hip extension      Hip abduction      Hip adduction      Hip internal rotation [facility]  Hip external rotation [facility]  Knee flexion [facility] WFL  Knee extension WFL WFL   (Blank rows = not tested)   LOWER EXTREMITY MMT:     MMT Right eval Left eval  Hip flexion 4- 4-  Hip extension      Hip abduction 5 5  Hip adduction      Hip internal rotation      Hip external rotation      Knee flexion 5  5  Knee extension 5 5   (Blank rows = not tested)   LUMBAR SPECIAL TESTS:  Straight leg raise test: Positive and Axial loading: positive    FUNCTIONAL TESTS:  NONE   GAIT: Distance walked: 19ft  Assistive device utilized: None Level of assistance: Complete Independence Comments: No antalgic gait pattern noted.    TODAY'S TREATMENT    11/18/21: Answered all pt's questions regarding initial HEP. Prone TA contraction 2x6: added to HEP Prone TA with LT knee flexion 2x10: added to HEP Cat/Camel in quadruped 10x PPt 6x, then right into articulating bridge 6x: VC to not grip with hamstrings Added green band to supine clamshells: 2x5 pt can do with neutral spine: Gave pt band to take home  Nustep: L5 6 min with PTA present to gather HEP and monitor pt    11/15/21: Nustep L4 6 min with PTA present to discuss current status. Review of initial HEP:Pt has difficulty flexing lumbar in seated cat cow. Changed to quadruped and did better with lumbar ROM so she will continue with that. Independent in all  others. Supine TA with Pilates breath 10x: added to HEP Supine TA with Bil  clamshell. 10x : added to HEP Supine articulating bridge 10x for HEP Verbal ed on how to stand groundig through her feet more to engage her glutes better in standing.     Creating, reviewing, and completing below HEP. Moist heat applied to lower back at end of session today.     PATIENT EDUCATION:  Education details: Educated pt on anatomy and physiology of current symptoms, FOTO, diagnosis, prognosis, HEP,  and POC. Person educated: Patient Education method: Customer service manager Education comprehension: verbalized understanding     HOME EXERCISE PROGRAM: Access Code: [email or web address] URL: [email or web address] Date: 11/08/2021 Prepared by: Rudi Heap   Exercises - Supine Posterior Pelvic Tilt  - 2 x daily - 7 x weekly - 2 sets - 10 reps - Seated Cat Cow  - 2 x daily - 7 x weekly - 2 sets - 10 reps - 2 hold - Supine Hamstring Stretch with Strap  - 2 x daily - 7 x weekly - 2 sets - 2 reps - 30 hold 11/15/21: Added Quadruped cat/camel, supine TA, TA with clamshell, and articulating bridge.  Added 11/18/21: prone TA with pillow under pelvis, prone knee flexion LTLE, green band to clamshells    ASSESSMENT:   CLINICAL IMPRESSION: Pt arrives with a dull aches in her low back. Overall doing very well with Pilates exercises, pt had many questions that were answered fully. Prone exercises added to progress HEP. Although prone exercises were difficult pt could did them well enough and were painfree.   OBJECTIVE IMPAIRMENTS decreased endurance, decreased mobility, decreased ROM, decreased strength, improper body mechanics, and pain.    ACTIVITY LIMITATIONS carrying, sitting, squatting, and caring for others   PARTICIPATION LIMITATIONS: laundry and driving   PERSONAL FACTORS Behavior pattern, Past/current experiences, Time since onset of injury/illness/exacerbation, and 1 comorbidity: Anemia  are  also affecting patient's functional outcome.    REHAB POTENTIAL: Good   CLINICAL DECISION MAKING: Stable/uncomplicated   EVALUATION COMPLEXITY: Low     GOALS: Goals reviewed with patient? No   SHORT TERM GOALS: Target date: 12/06/2021   Pt will be I and compliant with initial HEP. Baseline: provided at eval Goal status: Met 11/15/21    LONG TERM GOALS: Target date: 01/03/2022   Pt will be independent with advanced HEP to continue to address postural limitations and muscle  imbalances. Baseline: not provided Goal status: INITIAL   3.  Pt will improve FOTO function score to no less than 75% as proxy for functional improvement Baseline:  Goal status: INITIAL   4.  Pt will report </=210 pain at baseline  Baseline:  Goal status: INITIAL   5.  Pt will improve hip flexion Mmt to 5/5 BIL Baseline: 4- Goal status: INITIAL   6.  Pt will improve HS flexibility to WNL. Baseline:  Goal status: INITIAL     PLAN: PT FREQUENCY: 1-2x/week   PT DURATION: 8 weeks   PLANNED INTERVENTIONS: Therapeutic exercises, Therapeutic activity, Neuromuscular re-education, Balance training, Gait training, Patient/Family education, Self Care, Joint mobilization, Joint manipulation, Stair training, Aquatic Therapy, Dry Needling, Electrical stimulation, Spinal manipulation, Spinal mobilization, Cryotherapy, Moist heat, Vasopneumatic device, Traction, Ultrasound, Ionotophoresis 24m/ml Dexamethasone, Manual therapy, and Re-evaluation.   PLAN FOR NEXT SESSION: Review new exercises given today and progress      Khamiyah Grefe, PTA 11/18/2021, 9:24 AM

## 2021-11-18 NOTE — Progress Notes (Signed)
Established Patient Office Visit     CC/Reason for Visit: Discuss hemorrhoids  HPI: April Cowan is a 61 y.o. female who is coming in today for the above mentioned reasons.  She had has had hemorrhoids in the past that responded well to over-the-counter treatment.  She had reemergence of her hemorrhoids about 10 days ago.  She has been using over-the-counter wipes without relief.  She has not noted any bleeding.  She has not been constipated.  Past Medical/Surgical History: Past Medical History:  Diagnosis Date   Abnormal Pap smear of cervix    Anemia    taking B 12 injections   Hyperlipidemia    Spider veins    Vaginal atrophy     Past Surgical History:  Procedure Laterality Date   CERVICAL BIOPSY  W/ LOOP ELECTRODE EXCISION     COLPOSCOPY      Social History:  reports that she has never smoked. She has never used smokeless tobacco. She reports current alcohol use. She reports that she does not use drugs.  Allergies: Allergies  Allergen Reactions   Sulfa Antibiotics     Family History:  Family History  Problem Relation Age of Onset   Arthritis Paternal Grandmother    Colon cancer Paternal Grandmother    Breast cancer Paternal Grandmother    Lung cancer Paternal Grandfather    Hyperlipidemia Mother    Heart disease Father    Hypertension Father    Atrial fibrillation Father    Colon polyps Father    Esophageal cancer Neg Hx    Stomach cancer Neg Hx    Rectal cancer Neg Hx      Current Outpatient Medications:    atorvastatin (LIPITOR) 20 MG tablet, TAKE ONE TABLET BY MOUTH DAILY, Disp: 90 tablet, Rfl: 1   Cyanocobalamin (B-12 COMPLIANCE INJECTION IJ), Inject as directed., Disp: , Rfl:    estradiol (ESTRACE) 0.1 MG/GM vaginal cream, Place one gram intravaginally twice weekly., Disp: 42.5 g, Rfl: 2   hydrocortisone (ANUSOL-HC) 2.5 % rectal cream, Place 1 Application rectally 2 (two) times daily., Disp: 30 g, Rfl: 2  Review of Systems:   Constitutional: Denies fever, chills, diaphoresis, appetite change and fatigue.  HEENT: Denies photophobia, eye pain, redness, hearing loss, ear pain, congestion, sore throat, rhinorrhea, sneezing, mouth sores, trouble swallowing, neck pain, neck stiffness and tinnitus.   Respiratory: Denies SOB, DOE, cough, chest tightness,  and wheezing.   Cardiovascular: Denies chest pain, palpitations and leg swelling.  Gastrointestinal: Denies nausea, vomiting, abdominal pain, diarrhea, constipation, blood in stool and abdominal distention.  Genitourinary: Denies dysuria, urgency, frequency, hematuria, flank pain and difficulty urinating.  Endocrine: Denies: hot or cold intolerance, sweats, changes in hair or nails, polyuria, polydipsia. Musculoskeletal: Denies myalgias, back pain, joint swelling, arthralgias and gait problem.  Skin: Denies pallor, rash and wound.  Neurological: Denies dizziness, seizures, syncope, weakness, light-headedness, numbness and headaches.  Hematological: Denies adenopathy. Easy bruising, personal or family bleeding history  Psychiatric/Behavioral: Denies suicidal ideation, mood changes, confusion, nervousness, sleep disturbance and agitation    Physical Exam: Vitals:   11/18/21 1550  BP: 120/80  Pulse: (!) 59  Temp: 98.2 F (36.8 C)  TempSrc: Oral  SpO2: 98%  Weight: 163 lb (73.9 kg)  PF: (!) 5 L/min    Body mass index is 25.91 kg/m.   Constitutional: NAD, calm, comfortable Eyes: PERRL, lids and conjunctivae normal ENMT: Mucous membranes are moist.  Psychiatric: Normal judgment and insight. Alert and oriented x 3. Normal  mood.    Impression and Plan:  Hemorrhoids, unspecified hemorrhoid type  - Plan: hydrocortisone (ANUSOL-HC) 2.5 % rectal cream, sitz bath's, stool softener. -If no improvement can consider surgical referral.    Time spent:22 minutes reviewing chart, interviewing and examining patient and formulating plan of care.    Lelon Frohlich, MD [facility]

## 2021-11-21 NOTE — Therapy (Deleted)
OUTPATIENT PHYSICAL THERAPY TREATMENT NOTE   Patient Name: April Cowan MRN: [id number] DOB:April 13, 1960, 61 y.o., female Today's Date: 11/21/2021  PCP: Isaac Bliss, Rayford Halsted, MD REFERRING PROVIDER: Isaac Bliss, Rayford Halsted, MD  END OF SESSION:      Past Medical History:  Diagnosis Date   Abnormal Pap smear of cervix    Anemia    taking B 12 injections   Hyperlipidemia    Spider veins    Vaginal atrophy    Past Surgical History:  Procedure Laterality Date   CERVICAL BIOPSY  W/ LOOP ELECTRODE EXCISION     COLPOSCOPY     Patient Active Problem List   Diagnosis Date Noted   Vitamin D deficiency 06/22/2020   Vitamin B12 deficiency 06/22/2020   Hyperlipidemia 06/22/2020    REFERRING DIAG: M54.50,G89.29 (ICD-10-CM) - Chronic midline low back pain   THERAPY DIAG:  No diagnosis found.  Rationale for Evaluation and Treatment Rehabilitation  PERTINENT HISTORY: Anemia  PRECAUTIONS: none  SUBJECTIVE: I am enjoying my exercises a lot, but I have questions.   PAIN:  Are you having pain? Dull low back pain currently. Not sure why. Exercises seem to help.    OBJECTIVE: (objective measures completed at initial evaluation unless otherwise dated)  DIAGNOSTIC FINDINGS:  None   PATIENT SURVEYS:  FOTO 67.0400,    SCREENING FOR RED FLAGS: Bowel or bladder incontinence: No Spinal tumors: No Cauda equina syndrome: No Compression fracture: No Abdominal aneurysm: No   COGNITION:           Overall cognitive status: Within functional limits for tasks assessed                          SENSATION: WFL   MUSCLE LENGTH: Hamstrings: Right 60 deg; Left 62 deg   POSTURE: No Significant postural limitations   PALPATION: No pain with palpation. Pain noted with axial compression.    LUMBAR ROM:    Active  A/PROM  eval  Flexion 66  Extension 8  Right lateral flexion Too knee joint  Left lateral flexion Too knee joint with reported pull.   Right rotation WFL   Left rotation WFL   (Blank rows = not tested)   LOWER EXTREMITY ROM:      Active  Right eval Left eval  Hip flexion [facility]  Hip extension      Hip abduction      Hip adduction      Hip internal rotation [facility]  Hip external rotation [facility]  Knee flexion [facility] WFL  Knee extension WFL WFL   (Blank rows = not tested)   LOWER EXTREMITY MMT:     MMT Right eval Left eval  Hip flexion 4- 4-  Hip extension      Hip abduction 5 5  Hip adduction      Hip internal rotation      Hip external rotation      Knee flexion 5 5  Knee extension 5 5   (Blank rows = not tested)   LUMBAR SPECIAL TESTS:  Straight leg raise test: Positive and Axial loading: positive    FUNCTIONAL TESTS:  NONE   GAIT: Distance walked: 22f  Assistive device utilized: None Level of assistance: Complete Independence Comments: No antalgic gait pattern noted.    TODAY'S TREATMENT    11/18/21: Answered all pt's questions regarding initial HEP. Prone TA contraction 2x6: added to HEP Prone TA with LT knee flexion 2x10:  added to HEP Cat/Camel in quadruped 10x PPt 6x, then right into articulating bridge 6x: VC to not grip with hamstrings Added green band to supine clamshells: 2x5 pt can do with neutral spine: Gave pt band to take home  Nustep: L5 6 min with PTA present to gather HEP and monitor pt    11/15/21: Nustep L4 6 min with PTA present to discuss current status. Review of initial HEP:Pt has difficulty flexing lumbar in seated cat cow. Changed to quadruped and did better with lumbar ROM so she will continue with that. Independent in all others. Supine TA with Pilates breath 10x: added to HEP Supine TA with Bil  clamshell. 10x : added to HEP Supine articulating bridge 10x for HEP Verbal ed on how to stand groundig through her feet more to engage her glutes better in standing.     Creating, reviewing, and completing below HEP. Moist heat applied to lower back at end of session today.      PATIENT EDUCATION:  Education details: Educated pt on anatomy and physiology of current symptoms, FOTO, diagnosis, prognosis, HEP,  and POC. Person educated: Patient Education method: Customer service manager Education comprehension: verbalized understanding     HOME EXERCISE PROGRAM: Access Code: [email or web address] URL: [email or web address] Date: 11/08/2021 Prepared by: Rudi Heap   Exercises - Supine Posterior Pelvic Tilt  - 2 x daily - 7 x weekly - 2 sets - 10 reps - Seated Cat Cow  - 2 x daily - 7 x weekly - 2 sets - 10 reps - 2 hold - Supine Hamstring Stretch with Strap  - 2 x daily - 7 x weekly - 2 sets - 2 reps - 30 hold 11/15/21: Added Quadruped cat/camel, supine TA, TA with clamshell, and articulating bridge.  Added 11/18/21: prone TA with pillow under pelvis, prone knee flexion LTLE, green band to clamshells    ASSESSMENT:   CLINICAL IMPRESSION: Pt arrives with a dull aches in her low back. Overall doing very well with Pilates exercises, pt had many questions that were answered fully. Prone exercises added to progress HEP. Although prone exercises were difficult pt could did them well enough and were painfree.   OBJECTIVE IMPAIRMENTS decreased endurance, decreased mobility, decreased ROM, decreased strength, improper body mechanics, and pain.    ACTIVITY LIMITATIONS carrying, sitting, squatting, and caring for others   PARTICIPATION LIMITATIONS: laundry and driving   PERSONAL FACTORS Behavior pattern, Past/current experiences, Time since onset of injury/illness/exacerbation, and 1 comorbidity: Anemia  are also affecting patient's functional outcome.    REHAB POTENTIAL: Good   CLINICAL DECISION MAKING: Stable/uncomplicated   EVALUATION COMPLEXITY: Low     GOALS: Goals reviewed with patient? No   SHORT TERM GOALS: Target date: 12/06/2021   Pt will be I and compliant with initial HEP. Baseline: provided at eval Goal status: Met 11/15/21    LONG TERM  GOALS: Target date: 01/03/2022   Pt will be independent with advanced HEP to continue to address postural limitations and muscle imbalances. Baseline: not provided Goal status: INITIAL   3.  Pt will improve FOTO function score to no less than 75% as proxy for functional improvement Baseline:  Goal status: INITIAL   4.  Pt will report </=210 pain at baseline  Baseline:  Goal status: INITIAL   5.  Pt will improve hip flexion Mmt to 5/5 BIL Baseline: 4- Goal status: INITIAL   6.  Pt will improve HS flexibility to WNL. Baseline:  Goal status: INITIAL  PLAN: PT FREQUENCY: 1-2x/week   PT DURATION: 8 weeks   PLANNED INTERVENTIONS: Therapeutic exercises, Therapeutic activity, Neuromuscular re-education, Balance training, Gait training, Patient/Family education, Self Care, Joint mobilization, Joint manipulation, Stair training, Aquatic Therapy, Dry Needling, Electrical stimulation, Spinal manipulation, Spinal mobilization, Cryotherapy, Moist heat, Vasopneumatic device, Traction, Ultrasound, Ionotophoresis 53m/ml Dexamethasone, Manual therapy, and Re-evaluation.   PLAN FOR NEXT SESSION: Review new exercises given today and progress      SLynden Ang PT 11/21/2021, 10:33 AM

## 2021-11-22 ENCOUNTER — Ambulatory Visit: Payer: Commercial Managed Care - HMO | Admitting: Physical Therapy

## 2021-11-27 ENCOUNTER — Encounter: Payer: Self-pay | Admitting: Rehabilitative and Restorative Service Providers"

## 2021-11-27 ENCOUNTER — Ambulatory Visit
Payer: Commercial Managed Care - HMO | Attending: Internal Medicine | Admitting: Rehabilitative and Restorative Service Providers"

## 2021-11-27 DIAGNOSIS — M5459 Other low back pain: Secondary | ICD-10-CM | POA: Insufficient documentation

## 2021-11-27 DIAGNOSIS — M6281 Muscle weakness (generalized): Secondary | ICD-10-CM | POA: Diagnosis present

## 2021-11-27 NOTE — Therapy (Signed)
OUTPATIENT PHYSICAL THERAPY TREATMENT NOTE   Patient Name: April Cowan MRN: [id number] DOB:1960/07/27, 61 y.o., female Today's Date: 11/27/2021  PCP: Isaac Bliss, Rayford Halsted, MD REFERRING PROVIDER: Isaac Bliss, Rayford Halsted, MD  END OF SESSION:   PT End of Session - 11/27/21 0804     Visit Number 4    Date for PT Re-Evaluation 01/02/22    Authorization Type [employer] [employer]    PT Start Time 0800    PT Stop Time 0840    PT Time Calculation (min) 40 min    Activity Tolerance Patient tolerated treatment well    Behavior During Therapy WFL for tasks assessed/performed              Past Medical History:  Diagnosis Date   Abnormal Pap smear of cervix    Anemia    taking B 12 injections   Hyperlipidemia    Spider veins    Vaginal atrophy    Past Surgical History:  Procedure Laterality Date   CERVICAL BIOPSY  W/ LOOP ELECTRODE EXCISION     COLPOSCOPY     Patient Active Problem List   Diagnosis Date Noted   Vitamin D deficiency 06/22/2020   Vitamin B12 deficiency 06/22/2020   Hyperlipidemia 06/22/2020    REFERRING DIAG: M54.50,G89.29 (ICD-10-CM) - Chronic midline low back pain   THERAPY DIAG:  Other low back pain  Muscle weakness (generalized)  Rationale for Evaluation and Treatment Rehabilitation  PERTINENT HISTORY: Anemia  PRECAUTIONS: none  SUBJECTIVE: Pt reports that she is feeling better overall.  PAIN:  Are you having pain? Reports 1/10 this morning after doing her exercises, states some pain radiating into left hip   OBJECTIVE: (objective measures completed at initial evaluation unless otherwise dated)  DIAGNOSTIC FINDINGS:  None   PATIENT SURVEYS:  FOTO 67.0400,    SCREENING FOR RED FLAGS: Bowel or bladder incontinence: No Spinal tumors: No Cauda equina syndrome: No Compression fracture: No Abdominal aneurysm: No   COGNITION:           Overall cognitive status: Within functional limits for tasks assessed                           SENSATION: WFL   MUSCLE LENGTH: Hamstrings: Right 60 deg; Left 62 deg   POSTURE: No Significant postural limitations   PALPATION: No pain with palpation. Pain noted with axial compression.    LUMBAR ROM:    Active  A/PROM  eval  Flexion 66  Extension 8  Right lateral flexion Too knee joint  Left lateral flexion Too knee joint with reported pull.   Right rotation WFL  Left rotation WFL   (Blank rows = not tested)   LOWER EXTREMITY ROM:      Active  Right eval Left eval  Hip flexion [facility]  Hip extension      Hip abduction      Hip adduction      Hip internal rotation [facility]  Hip external rotation [facility]  Knee flexion [facility] WFL  Knee extension WFL WFL   (Blank rows = not tested)   LOWER EXTREMITY MMT:     MMT Right eval Left eval  Hip flexion 4- 4-  Hip extension      Hip abduction 5 5  Hip adduction      Hip internal rotation      Hip external rotation      Knee flexion 5 5  Knee extension 5 5   (Blank rows = not tested)   LUMBAR SPECIAL TESTS:  Straight leg raise test: Positive and Axial loading: positive    FUNCTIONAL TESTS:  NONE   GAIT: Distance walked: 36f  Assistive device utilized: None Level of assistance: Complete Independence Comments: No antalgic gait pattern noted.    TODAY'S TREATMENT:  11/27/2021: Nustep level 6 x8 min with PT present to discuss status Supine posterior pelvic tilt (PPT) 2x10 Supine PPT with marching 2x10 Supine clamshell with yellow loop 2x10 with cuing to not push too far to prevent muscle strain Supine hamstring stretch with strap 2x20 sec bilat Lower trunk rotation 5x10 sec bilat Quadruped cat/cow 2x10 Childs pose 2x20 sec Prone:  TA contraction, HS curl, hip extension.  2x10 Prone on elbows x2 min Prone press-up 2x10 Sit to/from stand holding 5# kettlebell 2x10 with cuing to maintain core stability   11/18/21: Answered all pt's questions regarding initial HEP. Prone TA contraction 2x6: added  to HEP Prone TA with LT knee flexion 2x10: added to HEP Cat/Camel in quadruped 10x PPt 6x, then right into articulating bridge 6x: VC to not grip with hamstrings Added green band to supine clamshells: 2x5 pt can do with neutral spine: Gave pt band to take home  Nustep: L5 6 min with PTA present to gather HEP and monitor pt    11/15/21: Nustep L4 6 min with PTA present to discuss current status. Review of initial HEP:Pt has difficulty flexing lumbar in seated cat cow. Changed to quadruped and did better with lumbar ROM so she will continue with that. Independent in all others. Supine TA with Pilates breath 10x: added to HEP Supine TA with Bil  clamshell. 10x : added to HEP Supine articulating bridge 10x for HEP Verbal ed on how to stand groundig through her feet more to engage her glutes better in standing.        PATIENT EDUCATION:  Education details: Educated pt on anatomy and physiology of current symptoms, FOTO, diagnosis, prognosis, HEP,  and POC. Person educated: Patient Education method: ECustomer service managerEducation comprehension: verbalized understanding     HOME EXERCISE PROGRAM: Access Code: [email or web address]: [email or web address] Date: 11/27/2021 Prepared by: SShelby DubinMenke  Exercises - Supine Posterior Pelvic Tilt  - 2 x daily - 7 x weekly - 2 sets - 10 reps - Seated Cat Cow  - 2 x daily - 7 x weekly - 2 sets - 10 reps - 2 hold - Supine Hamstring Stretch with Strap  - 2 x daily - 7 x weekly - 2 sets - 2 reps - 30 hold - Quadruped Cat Cow  - 2 x daily - 7 x weekly - 1 sets - 10 reps - Supine Transversus Abdominis Bracing - Hands on Stomach  - 3 x daily - 7 x weekly - 1 sets - 10 reps - 5 hold - Supine Transversus Abdominis Bracing with Double Leg Fallout  - 1 x daily - 7 x weekly - 2 sets - 10 reps - Supine Bridge with Spinal Articulation  - 1 x daily - 7 x weekly - 1 sets - 10 reps - Prone Transversus Abdominus Contraction  - 1 x daily - 7 x  weekly - 3 sets - 10 reps - Prone Knee Flexion AROM  - 2 x daily - 7 x weekly - 3 sets - 10 reps    ASSESSMENT:   CLINICAL IMPRESSION: Ms Mastrogiovanni arrives to skilled PT reporting overall improvements in pain stating  that she can tell that she feels better when she exercises. Pt reports that next week she will need to be out of town to attend to her mother who is having a procedure, but she will perform her HEP.  Pt reported decreased pain into left hip with prone on elbows position.  Pt continues to require skilled PT to progress towards goal related activities.   OBJECTIVE IMPAIRMENTS decreased endurance, decreased mobility, decreased ROM, decreased strength, improper body mechanics, and pain.    ACTIVITY LIMITATIONS carrying, sitting, squatting, and caring for others   PARTICIPATION LIMITATIONS: laundry and driving   PERSONAL FACTORS Behavior pattern, Past/current experiences, Time since onset of injury/illness/exacerbation, and 1 comorbidity: Anemia  are also affecting patient's functional outcome.    REHAB POTENTIAL: Good   CLINICAL DECISION MAKING: Stable/uncomplicated   EVALUATION COMPLEXITY: Low     GOALS: Goals reviewed with patient? No   SHORT TERM GOALS: Target date: 12/06/2021   Pt will be I and compliant with initial HEP. Baseline: provided at eval Goal status: Met 11/15/21    LONG TERM GOALS: Target date: 01/03/2022   Pt will be independent with advanced HEP to continue to address postural limitations and muscle imbalances. Baseline: not provided Goal status: Ongoing   3.  Pt will improve FOTO function score to no less than 75% as proxy for functional improvement Baseline:  Goal status: INITIAL   4.  Pt will report </=210 pain at baseline  Baseline:  Goal status: INITIAL   5.  Pt will improve hip flexion Mmt to 5/5 BIL Baseline: 4- Goal status: INITIAL   6.  Pt will improve HS flexibility to WNL. Baseline:  Goal status: INITIAL     PLAN: PT FREQUENCY:  1-2x/week   PT DURATION: 8 weeks   PLANNED INTERVENTIONS: Therapeutic exercises, Therapeutic activity, Neuromuscular re-education, Balance training, Gait training, Patient/Family education, Self Care, Joint mobilization, Joint manipulation, Stair training, Aquatic Therapy, Dry Needling, Electrical stimulation, Spinal manipulation, Spinal mobilization, Cryotherapy, Moist heat, Vasopneumatic device, Traction, Ultrasound, Ionotophoresis 17m/ml Dexamethasone, Manual therapy, and Re-evaluation.   PLAN FOR NEXT SESSION: Review new exercises given today and progress      SJuel Burrow PT 11/27/2021, 9:04 AM  [facility] SG[place]adaGMart[place] [address]Phone # [phone] [phone]

## 2021-11-28 NOTE — Addendum Note (Signed)
Addended by: Lynden Ang on: 11/28/2021 08:42 AM   Modules accepted: Orders

## 2021-11-29 ENCOUNTER — Ambulatory Visit: Payer: Commercial Managed Care - HMO | Admitting: Physical Therapy

## 2021-11-29 ENCOUNTER — Encounter: Payer: Self-pay | Admitting: Physical Therapy

## 2021-11-29 DIAGNOSIS — M5459 Other low back pain: Secondary | ICD-10-CM | POA: Diagnosis not present

## 2021-11-29 DIAGNOSIS — M6281 Muscle weakness (generalized): Secondary | ICD-10-CM

## 2021-11-29 NOTE — Therapy (Signed)
OUTPATIENT PHYSICAL THERAPY TREATMENT NOTE   Patient Name: April Cowan MRN: [id number] DOB:1960-04-11, 61 y.o., female Today's Date: 11/29/2021  PCP: Isaac Bliss, Rayford Halsted, MD REFERRING PROVIDER: Isaac Bliss, Rayford Halsted, MD  END OF SESSION:   PT End of Session - 11/29/21 1058     Visit Number 5    Number of Visits 16    Date for PT Re-Evaluation 01/02/22    Authorization Type [employer] [place] [employer]    PT Start Time 1058    PT Stop Time 1140    PT Time Calculation (min) 42 min    Activity Tolerance Patient tolerated treatment well    Behavior During Therapy WFL for tasks assessed/performed              Past Medical History:  Diagnosis Date   Abnormal Pap smear of cervix    Anemia    taking B 12 injections   Hyperlipidemia    Spider veins    Vaginal atrophy    Past Surgical History:  Procedure Laterality Date   CERVICAL BIOPSY  W/ LOOP ELECTRODE EXCISION     COLPOSCOPY     Patient Active Problem List   Diagnosis Date Noted   Vitamin D deficiency 06/22/2020   Vitamin B12 deficiency 06/22/2020   Hyperlipidemia 06/22/2020    REFERRING DIAG: M54.50,G89.29 (ICD-10-CM) - Chronic midline low back pain   THERAPY DIAG:  Other low back pain  Muscle weakness (generalized)  Rationale for Evaluation and Treatment Rehabilitation  PERTINENT HISTORY: Anemia  PRECAUTIONS: none  SUBJECTIVE: Pt reports that she is feeling better overall.  PAIN:  I am not doing well emotionally. My sister was diagnosed with Cancer and I am having a difficult time with it. My back is ok some left leg pain intermittently.   OBJECTIVE: (objective measures completed at initial evaluation unless otherwise dated)  DIAGNOSTIC FINDINGS:  None   PATIENT SURVEYS:  FOTO 67.0400,    SCREENING FOR RED FLAGS: Bowel or bladder incontinence: No Spinal tumors: No Cauda equina syndrome: No Compression fracture: No Abdominal aneurysm: No   COGNITION:           Overall  cognitive status: Within functional limits for tasks assessed                          SENSATION: WFL   MUSCLE LENGTH: Hamstrings: Right 60 deg; Left 62 deg   POSTURE: No Significant postural limitations   PALPATION: No pain with palpation. Pain noted with axial compression.    LUMBAR ROM:    Active  A/PROM  eval  Flexion 66  Extension 8  Right lateral flexion Too knee joint  Left lateral flexion Too knee joint with reported pull.   Right rotation WFL  Left rotation WFL   (Blank rows = not tested)   LOWER EXTREMITY ROM:      Active  Right eval Left eval  Hip flexion [facility]  Hip extension      Hip abduction      Hip adduction      Hip internal rotation [facility]  Hip external rotation [facility]  Knee flexion [facility] WFL  Knee extension WFL WFL   (Blank rows = not tested)   LOWER EXTREMITY MMT:     MMT Right eval Left eval  Hip flexion 4- 4-  Hip extension      Hip abduction 5 5  Hip adduction      Hip internal  rotation      Hip external rotation      Knee flexion 5 5  Knee extension 5 5   (Blank rows = not tested)   LUMBAR SPECIAL TESTS:  Straight leg raise test: Positive and Axial loading: positive    FUNCTIONAL TESTS:  NONE   GAIT: Distance walked: 61f  Assistive device utilized: None Level of assistance: Complete Independence Comments: No antalgic gait pattern noted.    TODAY'S TREATMENT:  11/29/21: Nustep level 6 x8 min with PTA present to discuss status Supine posterior pelvic tilt (PPT) 2x10 Supine PPT with marching 2x10 Supine clamshell with yellow loop 2x10 Prone:  TA contraction, quad floss 10x, hip extension.  2x10 Sit to/from stand holding 5# kettlebell 2x10 with cuing to maintain core stability Supine articulating bridge; 10x Added yellow band clamshell to bridge 10x Quadruped Alt UE/LE 3 sec hold 3x Bil added to HEP  11/27/2021: Nustep level 6 x8 min with PT present to discuss status Supine posterior pelvic tilt (PPT) 2x10 Supine PPT  with marching 2x10 Supine clamshell with yellow loop 2x10 with cuing to not push too far to prevent muscle strain Supine hamstring stretch with strap 2x20 sec bilat Lower trunk rotation 5x10 sec bilat Quadruped cat/cow 2x10 Childs pose 2x20 sec Prone:  TA contraction, HS curl, hip extension.  2x10 Prone on elbows x2 min Prone press-up 2x10 Sit to/from stand holding 5# kettlebell 2x10 with cuing to maintain core stability   11/18/21: Answered all pt's questions regarding initial HEP. Prone TA contraction 2x6: added to HEP Prone TA with LT knee flexion 2x10: added to HEP Cat/Camel in quadruped 10x PPt 6x, then right into articulating bridge 6x: VC to not grip with hamstrings Added green band to supine clamshells: 2x5 pt can do with neutral spine: Gave pt band to take home  Nustep: L5 6 min with PTA present to gather HEP and monitor pt    11/15/21: Nustep L4 6 min with PTA present to discuss current status. Review of initial HEP:Pt has difficulty flexing lumbar in seated cat cow. Changed to quadruped and did better with lumbar ROM so she will continue with that. Independent in all others. Supine TA with Pilates breath 10x: added to HEP Supine TA with Bil  clamshell. 10x : added to HEP Supine articulating bridge 10x for HEP Verbal ed on how to stand groundig through her feet more to engage her glutes better in standing.        PATIENT EDUCATION:  Education details: Educated pt on anatomy and physiology of current symptoms, FOTO, diagnosis, prognosis, HEP,  and POC. Person educated: Patient Education method: ECustomer service managerEducation comprehension: verbalized understanding     HOME EXERCISE PROGRAM: Access Code: [email or web address]: [email or web address] Date: 11/27/2021 Prepared by: SShelby DubinMenke  Exercises - Supine Posterior Pelvic Tilt  - 2 x daily - 7 x weekly - 2 sets - 10 reps - Seated Cat Cow  - 2 x daily - 7 x weekly - 2 sets - 10 reps - 2 hold -  Supine Hamstring Stretch with Strap  - 2 x daily - 7 x weekly - 2 sets - 2 reps - 30 hold - Quadruped Cat Cow  - 2 x daily - 7 x weekly - 1 sets - 10 reps - Supine Transversus Abdominis Bracing - Hands on Stomach  - 3 x daily - 7 x weekly - 1 sets - 10 reps - 5 hold - Supine Transversus Abdominis Bracing with Double Leg Fallout  -  1 x daily - 7 x weekly - 2 sets - 10 reps - Supine Bridge with Spinal Articulation  - 1 x daily - 7 x weekly - 1 sets - 10 reps - Prone Transversus Abdominus Contraction  - 1 x daily - 7 x weekly - 3 sets - 10 reps - Prone Knee Flexion AROM  - 2 x daily - 7 x weekly - 3 sets - 10 reps   Added 11/29/21: Quadruped alt UE/LE extension Myrene Galas, PTA 11/29/21 11:40 AM   ASSESSMENT:   CLINICAL IMPRESSION: Pt having some serious home stress which is potentially affecting her back. It has felt more tense and painful over the past week. Pt did exceptionally well with exercises today, core contraction getting stronger and no pain in her back or leg. Left quad had a feeling of "looseness" after. Pt will be out of town next week.    OBJECTIVE IMPAIRMENTS decreased endurance, decreased mobility, decreased ROM, decreased strength, improper body mechanics, and pain.    ACTIVITY LIMITATIONS carrying, sitting, squatting, and caring for others   PARTICIPATION LIMITATIONS: laundry and driving   PERSONAL FACTORS Behavior pattern, Past/current experiences, Time since onset of injury/illness/exacerbation, and 1 comorbidity: Anemia  are also affecting patient's functional outcome.    REHAB POTENTIAL: Good   CLINICAL DECISION MAKING: Stable/uncomplicated   EVALUATION COMPLEXITY: Low     GOALS: Goals reviewed with patient? No   SHORT TERM GOALS: Target date: 12/06/2021   Pt will be I and compliant with initial HEP. Baseline: provided at eval Goal status: Met 11/15/21    LONG TERM GOALS: Target date: 01/03/2022   Pt will be independent with advanced HEP to continue to  address postural limitations and muscle imbalances. Baseline: not provided Goal status: Ongoing   3.  Pt will improve FOTO function score to no less than 75% as proxy for functional improvement Baseline:  Goal status: INITIAL   4.  Pt will report </=210 pain at baseline  Baseline:  Goal status: INITIAL   5.  Pt will improve hip flexion Mmt to 5/5 BIL Baseline: 4- Goal status: INITIAL   6.  Pt will improve HS flexibility to WNL. Baseline:  Goal status: INITIAL     PLAN: PT FREQUENCY: 1-2x/week   PT DURATION: 8 weeks   PLANNED INTERVENTIONS: Therapeutic exercises, Therapeutic activity, Neuromuscular re-education, Balance training, Gait training, Patient/Family education, Self Care, Joint mobilization, Joint manipulation, Stair training, Aquatic Therapy, Dry Needling, Electrical stimulation, Spinal manipulation, Spinal mobilization, Cryotherapy, Moist heat, Vasopneumatic device, Traction, Ultrasound, Ionotophoresis 55m/ml Dexamethasone, Manual therapy, and Re-evaluation.   PLAN FOR NEXT SESSION: Review new exercises given today and progress      Patricie Geeslin, PTA 11/29/2021, 11:40 AM  [facility] SPrescottGChap[place] [address]Phone # [phone] [phone]

## 2021-12-02 ENCOUNTER — Encounter: Payer: Commercial Managed Care - HMO | Admitting: Physical Therapy

## 2021-12-02 ENCOUNTER — Other Ambulatory Visit: Payer: Commercial Managed Care - HMO

## 2021-12-05 ENCOUNTER — Encounter: Payer: Commercial Managed Care - HMO | Admitting: Rehabilitative and Restorative Service Providers"

## 2021-12-09 ENCOUNTER — Encounter: Payer: Commercial Managed Care - HMO | Admitting: Physical Therapy

## 2021-12-10 ENCOUNTER — Other Ambulatory Visit: Payer: Commercial Managed Care - HMO

## 2021-12-11 ENCOUNTER — Other Ambulatory Visit: Payer: Self-pay | Admitting: Internal Medicine

## 2021-12-11 DIAGNOSIS — E785 Hyperlipidemia, unspecified: Secondary | ICD-10-CM

## 2021-12-13 ENCOUNTER — Encounter: Payer: Commercial Managed Care - HMO | Admitting: Physical Therapy

## 2021-12-19 ENCOUNTER — Other Ambulatory Visit (INDEPENDENT_AMBULATORY_CARE_PROVIDER_SITE_OTHER): Payer: Commercial Managed Care - HMO

## 2021-12-19 DIAGNOSIS — E559 Vitamin D deficiency, unspecified: Secondary | ICD-10-CM

## 2021-12-19 LAB — VITAMIN D 25 HYDROXY (VIT D DEFICIENCY, FRACTURES): VITD: 37.5 ng/mL (ref 30.00–100.00)

## 2021-12-20 ENCOUNTER — Encounter: Payer: Self-pay | Admitting: Physical Therapy

## 2021-12-20 ENCOUNTER — Ambulatory Visit: Payer: Commercial Managed Care - HMO | Admitting: Physical Therapy

## 2021-12-20 DIAGNOSIS — M5459 Other low back pain: Secondary | ICD-10-CM

## 2021-12-20 DIAGNOSIS — M6281 Muscle weakness (generalized): Secondary | ICD-10-CM

## 2021-12-20 NOTE — Therapy (Signed)
OUTPATIENT PHYSICAL THERAPY TREATMENT NOTE   Patient Name: Syliva Mee MRN: [id number] DOB:01-22-1961, 61 y.o., female Today's Date: 12/20/2021  PCP: Isaac Bliss, Rayford Halsted, MD REFERRING PROVIDER: Isaac Bliss, Rayford Halsted, MD  END OF SESSION:   PT End of Session - 12/20/21 0849     Visit Number 6    Number of Visits 16    Date for PT Re-Evaluation 01/02/22    Authorization Type [employer] [place] [employer]    PT Start Time [phone]    PT Stop Time 0925    PT Time Calculation (min) 38 min    Activity Tolerance Patient tolerated treatment well    Behavior During Therapy WFL for tasks assessed/performed               Past Medical History:  Diagnosis Date   Abnormal Pap smear of cervix    Anemia    taking B 12 injections   Hyperlipidemia    Spider veins    Vaginal atrophy    Past Surgical History:  Procedure Laterality Date   CERVICAL BIOPSY  W/ LOOP ELECTRODE EXCISION     COLPOSCOPY     Patient Active Problem List   Diagnosis Date Noted   Vitamin D deficiency 06/22/2020   Vitamin B12 deficiency 06/22/2020   Hyperlipidemia 06/22/2020    REFERRING DIAG: M54.50,G89.29 (ICD-10-CM) - Chronic midline low back pain   THERAPY DIAG:  Other low back pain  Muscle weakness (generalized)  Rationale for Evaluation and Treatment Rehabilitation  PERTINENT HISTORY: Anemia  PRECAUTIONS: none  SUBJECTIVE: All the traveling has really thrown me off my exercises.   PAIN:   No current pain  OBJECTIVE: (objective measures completed at initial evaluation unless otherwise dated)  DIAGNOSTIC FINDINGS:  None   PATIENT SURVEYS:  FOTO 67.0400,  12/20/21: FOTO 83   SCREENING FOR RED FLAGS: Bowel or bladder incontinence: No Spinal tumors: No Cauda equina syndrome: No Compression fracture: No Abdominal aneurysm: No   COGNITION:           Overall cognitive status: Within functional limits for tasks assessed                          SENSATION: WFL   MUSCLE  LENGTH: Hamstrings: Right 60 deg; Left 62 deg   POSTURE: No Significant postural limitations   PALPATION: No pain with palpation. Pain noted with axial compression.    LUMBAR ROM:    Active  A/PROM  eval  Flexion 66  Extension 8  Right lateral flexion Too knee joint  Left lateral flexion Too knee joint with reported pull.   Right rotation WFL  Left rotation WFL   (Blank rows = not tested)   LOWER EXTREMITY ROM:      Active  Right eval Left eval  Hip flexion [facility]  Hip extension      Hip abduction      Hip adduction      Hip internal rotation [facility]  Hip external rotation [facility]  Knee flexion [facility]  Knee extension WFL WFL   (Blank rows = not tested)   LOWER EXTREMITY MMT:     MMT Right eval Left eval Right 12/20/21 Left 12/20/21  Hip flexion 4- 4- 4 4+  Hip extension        Hip abduction 5 5    Hip adduction        Hip internal rotation  Hip external rotation        Knee flexion 5 5    Knee extension 5 5     (Blank rows = not tested)   LUMBAR SPECIAL TESTS:  Straight leg raise test: Positive and Axial loading: positive    FUNCTIONAL TESTS:  NONE   GAIT: Distance walked: 64f  Assistive device utilized: None Level of assistance: Complete Independence Comments: No antalgic gait pattern noted.    TODAY'S TREATMENT:   12/20/21:  Problem solved how to do HEP when providing care for [place] family and [place] verbally understood all concepts and suggestions.  MMT: see above Blue tband clamshell: 2x10 , gave blue band for HEP progression Blue band hip flexion in hooklying 10x Bil Supine hooklying 5# wt overhead 6x with core focus: tried table top LE with same exercise 6x: pt will choose which one to do based on day.  Nustep L3 x 10 min with concurrent FOTO  11/29/21: Nustep level 6 x8 min with PTA present to discuss status Supine posterior pelvic tilt (PPT) 2x10 Supine PPT with marching 2x10 Supine clamshell with  yellow loop 2x10 Prone:  TA contraction, quad floss 10x, hip extension.  2x10 Sit to/from stand holding 5# kettlebell 2x10 with cuing to maintain core stability Supine articulating bridge; 10x Added yellow band clamshell to bridge 10x Quadruped Alt UE/LE 3 sec hold 3x Bil added to HEP  11/27/2021: Nustep level 6 x8 min with PT present to discuss status Supine posterior pelvic tilt (PPT) 2x10 Supine PPT with marching 2x10 Supine clamshell with yellow loop 2x10 with cuing to not push too far to prevent muscle strain Supine hamstring stretch with strap 2x20 sec bilat Lower trunk rotation 5x10 sec bilat Quadruped cat/cow 2x10 Childs pose 2x20 sec Prone:  TA contraction, HS curl, hip extension.  2x10 Prone on elbows x2 min Prone press-up 2x10 Sit to/from stand holding 5# kettlebell 2x10 with cuing to maintain core stability      PATIENT EDUCATION:  Education details: Educated pt on anatomy and physiology of current symptoms, FOTO, diagnosis, prognosis, HEP,  and POC. Person educated: Patient Education method: ECustomer service managerEducation comprehension: verbalized understanding     HOME EXERCISE PROGRAM: Access Code: [email or web address]: [email or web address] Date: 11/27/2021 Prepared by: SShelby DubinMenke  Exercises - Supine Posterior Pelvic Tilt  - 2 x daily - 7 x weekly - 2 sets - 10 reps - Seated Cat Cow  - 2 x daily - 7 x weekly - 2 sets - 10 reps - 2 hold - Supine Hamstring Stretch with Strap  - 2 x daily - 7 x weekly - 2 sets - 2 reps - 30 hold - Quadruped Cat Cow  - 2 x daily - 7 x weekly - 1 sets - 10 reps - Supine Transversus Abdominis Bracing - Hands on Stomach  - 3 x daily - 7 x weekly - 1 sets - 10 reps - 5 hold - Supine Transversus Abdominis Bracing with Double Leg Fallout  - 1 x daily - 7 x weekly - 2 sets - 10 reps - Supine Bridge with Spinal Articulation  - 1 x daily - 7 x weekly - 1 sets - 10 reps - Prone Transversus Abdominus Contraction  - 1 x  daily - 7 x weekly - 3 sets - 10 reps - Prone Knee Flexion AROM  - 2 x daily - 7 x weekly - 3 sets - 10 reps   Added 11/29/21: Quadruped alt  UE/LE extension Myrene Galas, PTA 12/20/21 9:24 AM   ASSESSMENT:   CLINICAL IMPRESSION: Pt is going to (and has been already been doing) a lot of travel. Because of this she reports it has been very difficult to do any HEP. We discussed strategies to do small bouts of exercise when she can including activities she can do during travel. Therband strength was progress today so she can take with her during her travels. Hip flexors MMt was improved since eval. FOTO goal met today.   OBJECTIVE IMPAIRMENTS decreased endurance, decreased mobility, decreased ROM, decreased strength, improper body mechanics, and pain.    ACTIVITY LIMITATIONS carrying, sitting, squatting, and caring for others   PARTICIPATION LIMITATIONS: laundry and driving   PERSONAL FACTORS Behavior pattern, Past/current experiences, Time since onset of injury/illness/exacerbation, and 1 comorbidity: Anemia  are also affecting patient's functional outcome.    REHAB POTENTIAL: Good   CLINICAL DECISION MAKING: Stable/uncomplicated   EVALUATION COMPLEXITY: Low     GOALS: Goals reviewed with patient? No   SHORT TERM GOALS: Target date: 12/06/2021   Pt will be I and compliant with initial HEP. Baseline: provided at eval Goal status: Met 11/15/21    LONG TERM GOALS: Target date: 01/03/2022   Pt will be independent with advanced HEP to continue to address postural limitations and muscle imbalances. Baseline: not provided Goal status: Ongoing   3.  Pt will improve FOTO function score to no less than 75% as proxy for functional improvement Baseline:  Goal status: Goal met 12/20/21   4.  Pt will report </=210 pain at baseline  Baseline:  Goal status: INITIAL   5.  Pt will improve hip flexion Mmt to 5/5 BIL Baseline: 4- Goal status: INITIAL   6.  Pt will improve HS flexibility  to WNL. Baseline:  Goal status: INITIAL     PLAN: PT FREQUENCY: 1-2x/week   PT DURATION: 8 weeks   PLANNED INTERVENTIONS: Therapeutic exercises, Therapeutic activity, Neuromuscular re-education, Balance training, Gait training, Patient/Family education, Self Care, Joint mobilization, Joint manipulation, Stair training, Aquatic Therapy, Dry Needling, Electrical stimulation, Spinal manipulation, Spinal mobilization, Cryotherapy, Moist heat, Vasopneumatic device, Traction, Ultrasound, Ionotophoresis 36m/ml Dexamethasone, Manual therapy, and Re-evaluation.   PLAN FOR NEXT SESSION: Review new exercises given today and progress      Danniel Grenz, PTA 12/20/2021, 9:24 AM  [facility]deseGCut[place] [address]Phone # [phone] [phone]

## 2021-12-23 ENCOUNTER — Encounter: Payer: Self-pay | Admitting: Physical Therapy

## 2021-12-23 ENCOUNTER — Ambulatory Visit: Payer: Commercial Managed Care - HMO | Attending: Internal Medicine | Admitting: Physical Therapy

## 2021-12-23 ENCOUNTER — Ambulatory Visit: Payer: Commercial Managed Care - HMO

## 2021-12-23 ENCOUNTER — Ambulatory Visit (INDEPENDENT_AMBULATORY_CARE_PROVIDER_SITE_OTHER): Payer: Commercial Managed Care - HMO

## 2021-12-23 DIAGNOSIS — E538 Deficiency of other specified B group vitamins: Secondary | ICD-10-CM

## 2021-12-23 DIAGNOSIS — M5459 Other low back pain: Secondary | ICD-10-CM | POA: Insufficient documentation

## 2021-12-23 DIAGNOSIS — M6281 Muscle weakness (generalized): Secondary | ICD-10-CM | POA: Diagnosis present

## 2021-12-23 MED ORDER — CYANOCOBALAMIN 1000 MCG/ML IJ SOLN
1000.0000 ug | INTRAMUSCULAR | Status: AC
  Administered 2021-12-23 – 2022-02-21 (×3): 1000 ug via INTRAMUSCULAR

## 2021-12-23 NOTE — Therapy (Signed)
OUTPATIENT PHYSICAL THERAPY TREATMENT NOTE   Patient Name: April Cowan MRN: [id number] DOB:[birth year], 61 y.o., female Today's Date: 12/23/2021  PCP: Isaac Bliss, Rayford Halsted, MD REFERRING PROVIDER: Isaac Bliss, Rayford Halsted, MD  END OF SESSION:   PT End of Session - 12/23/21 1059     Visit Number 7    Number of Visits 16    Date for PT Re-Evaluation 01/02/22    Authorization Type [employer] [place] H[employer] CONNECT    PT Start Time 1058    PT Stop Time 1136    PT Time Calculation (min) 38 min    Activity Tolerance Patient tolerated treatment well    Behavior During Therapy WFL for tasks assessed/performed                Past Medical History:  Diagnosis Date   Abnormal Pap smear of cervix    Anemia    taking B 12 injections   Hyperlipidemia    Spider veins    Vaginal atrophy    Past Surgical History:  Procedure Laterality Date   CERVICAL BIOPSY  W/ LOOP ELECTRODE EXCISION     COLPOSCOPY     Patient Active Problem List   Diagnosis Date Noted   Vitamin D deficiency 06/22/2020   Vitamin B12 deficiency 06/22/2020   Hyperlipidemia 06/22/2020    REFERRING DIAG: M54.50,G89.29 (ICD-10-CM) - Chronic midline low back pain   THERAPY DIAG:  Other low back pain  Muscle weakness (generalized)  Rationale for Evaluation and Treatment Rehabilitation  PERTINENT HISTORY: Anemia  PRECAUTIONS: none  SUBJECTIVE: I caught up on my exercises this past weekend but I think I over did it a little bc I am a little sore.   PAIN:   No current pain but I am a little sore from my exercises.   OBJECTIVE: (objective measures completed at initial evaluation unless otherwise dated)  DIAGNOSTIC FINDINGS:  None   PATIENT SURVEYS:  FOTO 67.0400,  12/20/21: FOTO 83   SCREENING FOR RED FLAGS: Bowel or bladder incontinence: No Spinal tumors: No Cauda equina syndrome: No Compression fracture: No Abdominal aneurysm: No   COGNITION:           Overall cognitive status: Within  functional limits for tasks assessed                          SENSATION: WFL   MUSCLE LENGTH: Hamstrings: Right 60 deg; Left 62 deg   POSTURE: No Significant postural limitations   PALPATION: No pain with palpation. Pain noted with axial compression.    LUMBAR ROM:    Active  A/PROM  eval  Flexion 66  Extension 8  Right lateral flexion Too knee joint  Left lateral flexion Too knee joint with reported pull.   Right rotation WFL  Left rotation WFL   (Blank rows = not tested)   LOWER EXTREMITY ROM:      Active  Right eval Left eval  Hip flexion [facility]  Hip extension      Hip abduction      Hip adduction      Hip internal rotation [facility]  Hip external rotation [facility]  Knee flexion [facility]  Knee extension WFL WFL   (Blank rows = not tested)   LOWER EXTREMITY MMT:     MMT Right eval Left eval Right 12/20/21 Left 12/20/21  Hip flexion 4- 4- 4 4+  Hip extension        Hip  abduction 5 5    Hip adduction        Hip internal rotation        Hip external rotation        Knee flexion 5 5    Knee extension 5 5     (Blank rows = not tested)   LUMBAR SPECIAL TESTS:  Straight leg raise test: Positive and Axial loading: positive    FUNCTIONAL TESTS:  NONE   GAIT: Distance walked: 24f  Assistive device utilized: None Level of assistance: Complete Independence Comments: No antalgic gait pattern noted.    TODAY'S TREATMENT:   12/23/21: Nustep L4 x 10 min PTA present to discuss  current status Blue band clamshells 15x in slight PPT Blue band hip flexion 10x Bil Supine hooklying with 6# wt over head 10x, feet down: Vc with breathing Supine bridge with hip abd using blue band 10x Pilates sidelying series: (3) front kick, abd, small circles. Bil 6x each   12/20/21:  Problem solved how to do HEP when providing care for [place] family [place] understood all concepts and suggestions.  MMT: see above Blue tband clamshell:  2x10 , gave blue band for HEP progression Blue band hip flexion in hooklying 10x Bil Supine hooklying 5# wt overhead 6x with core focus: tried table top LE with same exercise 6x: pt will choose which one to do based on day.  Nustep L3 x 10 min with concurrent FOTO  11/29/21: Nustep level 6 x8 min with PTA present to discuss status Supine posterior pelvic tilt (PPT) 2x10 Supine PPT with marching 2x10 Supine clamshell with yellow loop 2x10 Prone:  TA contraction, quad floss 10x, hip extension.  2x10 Sit to/from stand holding 5# kettlebell 2x10 with cuing to maintain core stability Supine articulating bridge; 10x Added yellow band clamshell to bridge 10x Quadruped Alt UE/LE 3 sec hold 3x Bil added to HEP       PATIENT EDUCATION:  Education details: Educated pt on anatomy and physiology of current symptoms, FOTO, diagnosis, prognosis, HEP,  and POC. Person educated: Patient Education method: ECustomer service managerEducation comprehension: verbalized understanding     HOME EXERCISE PROGRAM: Access Code: [email or web address]: [email or web address] Date: 11/27/2021 Prepared by: SShelby DubinMenke  Exercises - Supine Posterior Pelvic Tilt  - 2 x daily - 7 x weekly - 2 sets - 10 reps - Seated Cat Cow  - 2 x daily - 7 x weekly - 2 sets - 10 reps - 2 hold - Supine Hamstring Stretch with Strap  - 2 x daily - 7 x weekly - 2 sets - 2 reps - 30 hold - Quadruped Cat Cow  - 2 x daily - 7 x weekly - 1 sets - 10 reps - Supine Transversus Abdominis Bracing - Hands on Stomach  - 3 x daily - 7 x weekly - 1 sets - 10 reps - 5 hold - Supine Transversus Abdominis Bracing with Double Leg Fallout  - 1 x daily - 7 x weekly - 2 sets - 10 reps - Supine Bridge with Spinal Articulation  - 1 x daily - 7 x weekly - 1 sets - 10 reps - Prone Transversus Abdominus Contraction  - 1 x daily - 7 x weekly - 3 sets - 10 reps - Prone Knee Flexion AROM  - 2 x daily - 7 x weekly - 3 sets - 10 reps   Added 11/29/21:  Quadruped alt UE/LE extension JMyrene Galas PTA  12/23/21 11:38 AM   Added 12/23/21: Pilates sidelying series inc: front kicks, abduction, small circles   Myrene Galas, PTA 12/23/21 11:38 AM   ASSESSMENT:   CLINICAL IMPRESSION: Pt reports she has been able to get back on her exercises since being home this past week. She will be traveling again soon. Pt independent in new exercises given last week. Pt HEP was again progressed to include Pilates sidelying series (first 3 ex of series).    OBJECTIVE IMPAIRMENTS decreased endurance, decreased mobility, decreased ROM, decreased strength, improper body mechanics, and pain.    ACTIVITY LIMITATIONS carrying, sitting, squatting, and caring for others   PARTICIPATION LIMITATIONS: laundry and driving   PERSONAL FACTORS Behavior pattern, Past/current experiences, Time since onset of injury/illness/exacerbation, and 1 comorbidity: Anemia  are also affecting patient's functional outcome.    REHAB POTENTIAL: Good   CLINICAL DECISION MAKING: Stable/uncomplicated   EVALUATION COMPLEXITY: Low     GOALS: Goals reviewed with patient? No   SHORT TERM GOALS: Target date: 12/06/2021   Pt will be I and compliant with initial HEP. Baseline: provided at eval Goal status: Met 11/15/21    LONG TERM GOALS: Target date: 01/03/2022   Pt will be independent with advanced HEP to continue to address postural limitations and muscle imbalances. Baseline: not provided Goal status: Ongoing   3.  Pt will improve FOTO function score to no less than 75% as proxy for functional improvement Baseline:  Goal status: Goal met 12/20/21   4.  Pt will report </=210 pain at baseline  Baseline:  Goal status: INITIAL   5.  Pt will improve hip flexion Mmt to 5/5 BIL Baseline: 4- Goal status: INITIAL   6.  Pt will improve HS flexibility to WNL. Baseline:  Goal status: INITIAL     PLAN: PT FREQUENCY: 1-2x/week   PT DURATION: 8 weeks   PLANNED  INTERVENTIONS: Therapeutic exercises, Therapeutic activity, Neuromuscular re-education, Balance training, Gait training, Patient/Family education, Self Care, Joint mobilization, Joint manipulation, Stair training, Aquatic Therapy, Dry Needling, Electrical stimulation, Spinal manipulation, Spinal mobilization, Cryotherapy, Moist heat, Vasopneumatic device, Traction, Ultrasound, Ionotophoresis 57m/ml Dexamethasone, Manual therapy, and Re-evaluation.   PLAN FOR NEXT SESSION: Reassessment next   [facility] PTA 12/23/2021, 11:38 AM  [facility] [place] [address]Phone # [phone] [phone]

## 2021-12-23 NOTE — Progress Notes (Signed)
Per orders of Dr. Jerilee Hoh, injection of Cyanocobalamin inj. 1000 mcg given by Encarnacion Slates. Patient tolerated injection well.  Patient's next injection is due next month.

## 2021-12-27 ENCOUNTER — Ambulatory Visit: Payer: Commercial Managed Care - HMO | Admitting: Physical Therapy

## 2022-01-01 ENCOUNTER — Encounter: Payer: Self-pay | Admitting: Rehabilitative and Restorative Service Providers"

## 2022-01-01 ENCOUNTER — Ambulatory Visit: Payer: Commercial Managed Care - HMO | Admitting: Rehabilitative and Restorative Service Providers"

## 2022-01-01 DIAGNOSIS — M6281 Muscle weakness (generalized): Secondary | ICD-10-CM

## 2022-01-01 DIAGNOSIS — M5459 Other low back pain: Secondary | ICD-10-CM

## 2022-01-01 NOTE — Therapy (Signed)
OUTPATIENT PHYSICAL THERAPY TREATMENT NOTE AND DISCHARGE SUMMARY   Patient Name: April Cowan MRN: [id number] DOB:[birth year], 61 y.o., female Today's Date: 01/01/2022  PCP: Isaac Bliss, Rayford Halsted, MD REFERRING PROVIDER: Isaac Bliss, Rayford Halsted, MD  END OF SESSION:   PT End of Session - 01/01/22 0733     Visit Number 8    Date for PT Re-Evaluation 01/02/22    Authorization Type [employer] [place] [employer] CONNECT    PT Start Time 0730    PT Stop Time 0800    PT Time Calculation (min) 30 min    Activity Tolerance Patient tolerated treatment well    Behavior During Therapy WFL for tasks assessed/performed                Past Medical History:  Diagnosis Date   Abnormal Pap smear of cervix    Anemia    taking B 12 injections   Hyperlipidemia    Spider veins    Vaginal atrophy    Past Surgical History:  Procedure Laterality Date   CERVICAL BIOPSY  W/ LOOP ELECTRODE EXCISION     COLPOSCOPY     Patient Active Problem List   Diagnosis Date Noted   Vitamin D deficiency 06/22/2020   Vitamin B12 deficiency 06/22/2020   Hyperlipidemia 06/22/2020    REFERRING DIAG: M54.50,G89.29 (ICD-10-CM) - Chronic midline low back pain   THERAPY DIAG:  Other low back pain  Muscle weakness (generalized)  Rationale for Evaluation and Treatment Rehabilitation  PERTINENT HISTORY: Anemia  PRECAUTIONS: none  SUBJECTIVE: Pt reports that she is feeling much better and feels that she is ready for discharge  PAIN:   No current pain but I am a little sore from my exercises.   OBJECTIVE: (objective measures completed at initial evaluation unless otherwise dated)  DIAGNOSTIC FINDINGS:  None   PATIENT SURVEYS:  FOTO 67.0400,  12/20/21: FOTO 83% (goal met)   SCREENING FOR RED FLAGS: Bowel or bladder incontinence: No Spinal tumors: No Cauda equina syndrome: No Compression fracture: No Abdominal aneurysm: No   COGNITION:           Overall cognitive status: Within functional  limits for tasks assessed                          SENSATION: WFL   MUSCLE LENGTH: Eval:  Hamstrings: Right 60 deg; Left 62 deg 01/01/2022:  Hamstring length is WFL   POSTURE: No Significant postural limitations   PALPATION: No pain with palpation. Pain noted with axial compression.    LUMBAR ROM:    Active  A/PROM  eval  Flexion 66  Extension 8  Right lateral flexion Too knee joint  Left lateral flexion Too knee joint with reported pull.   Right rotation WFL  Left rotation WFL   (Blank rows = not tested)   LOWER EXTREMITY ROM:      Active  Right eval Left eval  Hip flexion [facility]  Hip extension      Hip abduction      Hip adduction      Hip internal rotation [facility]  Hip external rotation [facility]  Knee flexion [facility] WFL  Knee extension WFL WFL   (Blank rows = not tested)   LOWER EXTREMITY MMT:    01/01/2022:  All LE strength is 5/5 throughout   MMT Right eval Left eval Right 12/20/21 Left 12/20/21  Hip flexion 4- 4- 4 4+  Hip extension  Hip abduction 5 5    Hip adduction        Hip internal rotation        Hip external rotation        Knee flexion 5 5    Knee extension 5 5     (Blank rows = not tested)   LUMBAR SPECIAL TESTS:  Straight leg raise test: Positive and Axial loading: positive    FUNCTIONAL TESTS:  NONE   GAIT: Distance walked: 59f  Assistive device utilized: None Level of assistance: Complete Independence Comments: No antalgic gait pattern noted.    TODAY'S TREATMENT:  01/01/2022:  Nustep level 5 x6 min with PT present to discuss status Supine hooklying with 6# wt over head and feet down: Vc with breathing.  2x10 Bridging with hip abduction with yellow loop 2x10 Pilates sidelying series: (3) front kick, abd, small circles. Bil 6x each Measured hip strength and hamstring flexibility   12/23/21: Nustep L4 x 10 min PTA present to discuss  current status Blue band clamshells 15x in slight PPT Blue band hip flexion 10x  Bil Supine hooklying with 6# wt over head 10x, feet down: Vc with breathing Supine bridge with hip abd using blue band 10x Pilates sidelying series: (3) front kick, abd, small circles. Bil 6x each   12/20/21: Problem solved how to do HEP when providing care for her family and [place] verbally understood all concepts and suggestions.  MMT: see above Blue tband clamshell: 2x10 , gave blue band for HEP progression Blue band hip flexion in hooklying 10x Bil Supine hooklying 5# wt overhead 6x with core focus: tried table top LE with same exercise 6x: pt will choose which one to do based on day.  Nustep L3 x 10 min with concurrent FOTO      PATIENT EDUCATION:  Education details: Educated pt on anatomy and physiology of current symptoms, FOTO, diagnosis, prognosis, HEP,  and POC. Person educated: Patient Education method: ECustomer service managerEducation comprehension: verbalized understanding     HOME EXERCISE PROGRAM: Access Code: [email or web address]: [email or web address] Date: 01/01/2022 Prepared by: SShelby DubinMenke  Exercises - Quadruped Cat Cow  - 2 x daily - 7 x weekly - 1 sets - 10 reps - Quadruped Fire Hydrant  - 1 x daily - 7 x weekly - 2 sets - 10 reps - Bird Dog  - 1 x daily - 7 x weekly - 1 sets - 3 reps - 5 hold - Supine Hamstring Stretch with Strap  - 2 x daily - 7 x weekly - 2 sets - 2 reps - 30 hold - Supine Posterior Pelvic Tilt  - 2 x daily - 7 x weekly - 2 sets - 10 reps - Supine Transversus Abdominis Bracing - Hands on Stomach  - 3 x daily - 7 x weekly - 1 sets - 10 reps - 5 hold - Supine Transversus Abdominis Bracing with Double Leg Fallout  - 1 x daily - 7 x weekly - 2 sets - 10 reps - Supine Bridge with Spinal Articulation  - 1 x daily - 7 x weekly - 1 sets - 10 reps - Prone Transversus Abdominus Contraction  - 1 x daily - 7 x weekly - 3 sets - 10 reps - Prone Knee Flexion AROM  - 2 x daily - 7 x weekly - 3 sets - 10  reps - Sidelying Hip Flexion  - 1 x daily - 7 x weekly - 1  sets - 10 reps - Sidelying Hip Abduction wtih Flexion and Extension (BKA)  - 1 x daily - 7 x weekly - 1 sets - 10 reps - Sidelying Hip Circles  - 1 x daily - 7 x weekly - 1 sets - 10 reps - Sidelying Pelvic Floor Contraction with Hip Abduction  - 1 x daily - 7 x weekly - 1 sets - 10 reps - Seated Cat Cow  - 2 x daily - 7 x weekly - 2 sets - 10 reps - 2 hold - Pelvic Tilt on Swiss Ball  - 1 x daily - 7 x weekly - 2 sets - 10 reps - Seated Lateral Pelvic Tilt on Swiss Ball  - 1 x daily - 7 x weekly - 2 sets - 10 reps   ASSESSMENT:   CLINICAL IMPRESSION: Ms Peyser presents to skilled PT reporting great progress towards goals.  Pt presents without pain and is compliant with HEP.  Pt has improved on hamstring flexibility and strength.  Pt has met all skilled PT goals and is ready for discharge from outpatient PT to continue with HEP.    OBJECTIVE IMPAIRMENTS decreased endurance, decreased mobility, decreased ROM, decreased strength, improper body mechanics, and pain.    ACTIVITY LIMITATIONS carrying, sitting, squatting, and caring for others   PARTICIPATION LIMITATIONS: laundry and driving   PERSONAL FACTORS Behavior pattern, Past/current experiences, Time since onset of injury/illness/exacerbation, and 1 comorbidity: Anemia  are also affecting patient's functional outcome.    REHAB POTENTIAL: Good   CLINICAL DECISION MAKING: Stable/uncomplicated   EVALUATION COMPLEXITY: Low     GOALS: Goals reviewed with patient? No   SHORT TERM GOALS: Target date: 12/06/2021   Pt will be I and compliant with initial HEP. Baseline: provided at eval Goal status: Met 11/15/21    LONG TERM GOALS: Target date: 01/03/2022   Pt will be independent with advanced HEP to continue to address postural limitations and muscle imbalances. Baseline: not provided Goal status: MET on 01/01/2022   2.  Pt will improve FOTO function score to no less than  75% as proxy for functional improvement Baseline:  Goal status: Goal met 12/20/21   3.  Pt will report </=210 pain at baseline  Baseline:  Goal status: MET on 01/01/2022   4.  Pt will improve hip flexion Mmt to 5/5 BIL Baseline: 4- Goal status: MET on 01/01/2022   5.  Pt will improve HS flexibility to WNL. Baseline:  Goal status: MET on 01/01/2022     PLAN: PT FREQUENCY: 1-2x/week   PT DURATION: 8 weeks   PLANNED INTERVENTIONS: Therapeutic exercises, Therapeutic activity, Neuromuscular re-education, Balance training, Gait training, Patient/Family education, Self Care, Joint mobilization, Joint manipulation, Stair training, Aquatic Therapy, Dry Needling, Electrical stimulation, Spinal manipulation, Spinal mobilization, Cryotherapy, Moist heat, Vasopneumatic device, Traction, Ultrasound, Ionotophoresis 10m/ml Dexamethasone, Manual therapy, and Re-evaluation.   PLAN FOR NEXT SESSION: Discharge completed on 01/01/22    PHYSICAL THERAPY DISCHARGE SUMMARY   Patient agrees to discharge. Patient goals were met. Patient is being discharged due to meeting the stated rehab goals.      SJuel Burrow PT 01/01/2022, 9:00 AM  [facility] [place] [address]Phone # [phone] [phone]

## 2022-01-03 ENCOUNTER — Ambulatory Visit: Payer: Commercial Managed Care - HMO | Admitting: Physical Therapy

## 2022-01-23 ENCOUNTER — Ambulatory Visit (INDEPENDENT_AMBULATORY_CARE_PROVIDER_SITE_OTHER): Payer: Commercial Managed Care - HMO

## 2022-01-23 DIAGNOSIS — E538 Deficiency of other specified B group vitamins: Secondary | ICD-10-CM

## 2022-01-23 NOTE — Progress Notes (Signed)
Pt here for monthly B12 injection per Dr Jerilee Hoh.  B12 1021mg given IM right deltoid and pt tolerated injection well.  Next B12 injection scheduled for 02/21/22.

## 2022-01-24 ENCOUNTER — Ambulatory Visit: Payer: Commercial Managed Care - HMO | Admitting: Family Medicine

## 2022-01-24 ENCOUNTER — Ambulatory Visit (INDEPENDENT_AMBULATORY_CARE_PROVIDER_SITE_OTHER): Payer: Commercial Managed Care - HMO

## 2022-01-24 ENCOUNTER — Encounter: Payer: Self-pay | Admitting: Family Medicine

## 2022-01-24 VITALS — BP 130/68 | HR 67 | Temp 98.2°F | Ht 66.5 in | Wt 163.3 lb

## 2022-01-24 DIAGNOSIS — R509 Fever, unspecified: Secondary | ICD-10-CM

## 2022-01-24 DIAGNOSIS — R059 Cough, unspecified: Secondary | ICD-10-CM

## 2022-01-24 DIAGNOSIS — R519 Headache, unspecified: Secondary | ICD-10-CM | POA: Diagnosis not present

## 2022-01-24 DIAGNOSIS — H6592 Unspecified nonsuppurative otitis media, left ear: Secondary | ICD-10-CM

## 2022-01-24 DIAGNOSIS — R0981 Nasal congestion: Secondary | ICD-10-CM | POA: Diagnosis not present

## 2022-01-24 LAB — POCT INFLUENZA A/B
Influenza A, POC: NEGATIVE
Influenza B, POC: NEGATIVE

## 2022-01-24 LAB — POC COVID19 BINAXNOW: SARS Coronavirus 2 Ag: NEGATIVE

## 2022-01-24 NOTE — Progress Notes (Signed)
Established Patient Office Visit  Subjective   Patient ID: April Cowan, female    DOB: 07-31-1960  Age: 61 y.o. MRN: [id number]  Chief Complaint  Patient presents with   Fever   Cough    Patient complains of cough, x5 days, Non productive, Tried Mucinex and Tylenol with little relief    Nasal Congestion   Ear Fullness    Patient complains of left ear fullness, x5 days     HPI   April Cowan is seen with onset this past weekend sore throat which lasted about a day.  This was followed by some nasal congestion and now cough.  Last night she had low-grade fever 100.7 but none today.  She recently traveled to [place] to visit his sister who has lung cancer.  She noticed some muffled sensation of her left ear since then but no ear pain.  No significant hearing loss.  No vertigo.  No right ear symptoms.  She has no history of smoking.  No reported appetite or weight changes.  Past Medical History:  Diagnosis Date   Abnormal Pap smear of cervix    Anemia    taking B 12 injections   Hyperlipidemia    Spider veins    Vaginal atrophy    Past Surgical History:  Procedure Laterality Date   CERVICAL BIOPSY  W/ LOOP ELECTRODE EXCISION     COLPOSCOPY      reports that she has never smoked. She has never used smokeless tobacco. She reports current alcohol use. She reports that she does not use drugs. family history includes Arthritis in her paternal grandmother; Atrial fibrillation in her father; Breast cancer in her paternal grandmother; Colon cancer in her paternal grandmother; Colon polyps in her father; Heart disease in her father; Hyperlipidemia in her mother; Hypertension in her father; Lung cancer in her paternal grandfather. Allergies  Allergen Reactions   Sulfa Antibiotics     Review of Systems  Constitutional:  Negative for chills and fever.  HENT:  Positive for congestion. Negative for ear discharge and ear pain.   Respiratory:  Positive for cough.       Objective:     BP  130/68 (BP Location: Right Arm, Patient Position: Sitting, Cuff Size: Normal)   Pulse 67   Temp 98.2 F (36.8 C) (Oral)   Ht 5' 6.5" (1.689 m)   Wt 163 lb 4.8 oz (74.1 kg)   LMP 04/07/2012   SpO2 98%   BMI 25.96 kg/m    Physical Exam Vitals reviewed.  Constitutional:      General: She is not in acute distress.    Appearance: Normal appearance. She is not ill-appearing.  HENT:     Head:     Comments: She has serous effusion noted along the inferior portion of left TM.  No erythema.  No purulent changes.    Right Ear: Tympanic membrane normal.  Cardiovascular:     Rate and Rhythm: Normal rate and regular rhythm.  Pulmonary:     Effort: Pulmonary effort is normal.     Breath sounds: Normal breath sounds. No wheezing or rales.  Neurological:     Mental Status: She is alert.      No results found for any visits on 01/24/22.    The ASCVD Risk score (Arnett DK, et al., 2019) failed to calculate for the following reasons:   The valid HDL cholesterol range is 20 to 100 mg/dL    Assessment & Plan:   Problem List  Items Addressed This Visit   None Visit Diagnoses     Nasal congestion    -  Primary   Relevant Orders   POC COVID-19   POC Influenza A/B   Cough, unspecified type       Relevant Orders   POC COVID-19   POC Influenza A/B   DG Chest 2 View   Nonintractable headache, unspecified chronicity pattern, unspecified headache type       Relevant Orders   POC COVID-19   POC Influenza A/B   Low grade fever       Relevant Orders   POC COVID-19   POC Influenza A/B     Probable viral URI with cough.  She has evidence for otitis media with effusion on the left but not the right. COVID testing and influenza testing negative -We discussed otitis media with effusion.  She is aware this may take several days if not weeks to fully clear -Watch closely for any recurrent fever -Patient would like to get chest x-ray partly because of angst over her sister having lung  cancer currently.  Will obtain PA and lateral chest x-ray to further assess although her lung exam is clear  No follow-ups on file.    Carolann Littler, MD

## 2022-01-29 ENCOUNTER — Telehealth: Payer: Self-pay | Admitting: Internal Medicine

## 2022-01-29 NOTE — Telephone Encounter (Signed)
Patient is aware.  See xray result note.

## 2022-01-29 NOTE — Telephone Encounter (Signed)
Pt is returning April Cowan call concerning xray

## 2022-02-21 ENCOUNTER — Ambulatory Visit (INDEPENDENT_AMBULATORY_CARE_PROVIDER_SITE_OTHER): Payer: Commercial Managed Care - HMO

## 2022-02-21 DIAGNOSIS — E538 Deficiency of other specified B group vitamins: Secondary | ICD-10-CM

## 2022-02-21 NOTE — Progress Notes (Signed)
Per orders of Dr. Burchette, injection of Cyanocobalamin 1000 mcg given by Rosely Fernandez L Marcell Chavarin. Patient tolerated injection well.  

## 2022-02-27 ENCOUNTER — Ambulatory Visit: Payer: Commercial Managed Care - HMO | Admitting: Obstetrics & Gynecology

## 2022-02-28 ENCOUNTER — Other Ambulatory Visit (HOSPITAL_COMMUNITY)
Admission: RE | Admit: 2022-02-28 | Discharge: 2022-02-28 | Disposition: A | Discharge status: Home / Self Care | Payer: Commercial Managed Care - HMO | Source: Ambulatory Visit | Attending: Obstetrics & Gynecology | Admitting: Obstetrics & Gynecology

## 2022-02-28 ENCOUNTER — Ambulatory Visit (INDEPENDENT_AMBULATORY_CARE_PROVIDER_SITE_OTHER): Payer: Commercial Managed Care - HMO | Admitting: Obstetrics & Gynecology

## 2022-02-28 ENCOUNTER — Encounter: Payer: Self-pay | Admitting: Obstetrics & Gynecology

## 2022-02-28 VITALS — BP 108/64 | HR 65 | Ht 66.75 in | Wt 158.0 lb

## 2022-02-28 DIAGNOSIS — R87610 Atypical squamous cells of undetermined significance on cytologic smear of cervix (ASC-US): Secondary | ICD-10-CM

## 2022-02-28 DIAGNOSIS — N870 Mild cervical dysplasia: Secondary | ICD-10-CM | POA: Diagnosis present

## 2022-02-28 DIAGNOSIS — Z01419 Encounter for gynecological examination (general) (routine) without abnormal findings: Secondary | ICD-10-CM | POA: Diagnosis present

## 2022-02-28 DIAGNOSIS — Z780 Asymptomatic menopausal state: Secondary | ICD-10-CM

## 2022-02-28 DIAGNOSIS — N952 Postmenopausal atrophic vaginitis: Secondary | ICD-10-CM

## 2022-02-28 DIAGNOSIS — M8589 Other specified disorders of bone density and structure, multiple sites: Secondary | ICD-10-CM

## 2022-02-28 MED ORDER — ESTRADIOL 0.1 MG/GM VA CREA: TOPICAL_CREAM | VAGINAL | 4 refills | Status: AC

## 2022-02-28 NOTE — Progress Notes (Signed)
April Cowan Nov 26, 1960 [id number]   History:    61 y.o.  G2P2L2  Married.  Retired.  Daughter 68 yo Pediatric OT.  Daughter 20 yo [place] school in Pediatric OT.       RP:  Established patient presenting for annual gyn exam    HPI: Postmenopause, well without hormone replacement therapy.  No postmenopausal bleeding.  No pelvic pain.  Normal vaginal secretions.  Pap ASCUS/HPV HR Neg again in 08/2021. 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  Superficial dyspareunia improved on Estradiol cream, would like to continue. Urine and bowel movements normal.  Breasts normal. Screening mammo 09/2021 Neg.  BMI 24.93.  Physically active. BD 11/2019 Osteopenia Lt Fem Neck -1.3.  Health labs with family physician.  Screening colonoscopy 07/2020. Flu done at pharmacy   Past medical history,surgical history, family history and social history were all reviewed and documented in the EPIC chart.  Gynecologic History Patient's last menstrual period was 04/07/2012.  Obstetric History OB History  Gravida Para Term Preterm AB Living  '2 2 2     2  '$ SAB IAB Ectopic Multiple Live Births               # Outcome Date GA Lbr Len/2nd Weight Sex Delivery Anes PTL Lv  2 Term           1 Term              ROS: A ROS was performed and pertinent positives and negatives are included in the history. GENERAL: No fevers or chills. HEENT: No change in vision, no earache, sore throat or sinus congestion. NECK: No pain or stiffness. CARDIOVASCULAR: No chest pain or pressure. No palpitations. PULMONARY: No shortness of breath, cough or wheeze. GASTROINTESTINAL: No abdominal pain, nausea, vomiting or diarrhea, melena or bright red blood per rectum. GENITOURINARY: No urinary frequency, urgency, hesitancy or dysuria. MUSCULOSKELETAL: No joint or muscle pain, no back pain, no recent trauma. DERMATOLOGIC: No rash, no itching, no lesions. ENDOCRINE: No polyuria, polydipsia, no heat or cold intolerance. No recent change in weight.  HEMATOLOGICAL: No anemia or easy bruising or bleeding. NEUROLOGIC: No headache, seizures, numbness, tingling or weakness. PSYCHIATRIC: No depression, no loss of interest in normal activity or change in sleep pattern.     Exam:   BP 108/64   Pulse 65   Ht 5' 6.75" (1.695 m)   Wt 158 lb (71.7 kg)   LMP 04/07/2012   SpO2 96%   BMI 24.93 kg/m   Body mass index is 24.93 kg/m.  General appearance : Well developed well nourished female. No acute distress HEENT: Eyes: no retinal hemorrhage or exudates,  Neck supple, trachea midline, no carotid bruits, no thyroidmegaly Lungs: Clear to auscultation, no rhonchi or wheezes, or rib retractions  Heart: Regular rate and rhythm, no murmurs or gallops Breast:Examined in sitting and supine position were symmetrical in appearance, no palpable masses or tenderness,  no skin retraction, no nipple inversion, no nipple discharge, no skin discoloration, no axillary or supraclavicular lymphadenopathy Abdomen: no palpable masses or tenderness, no rebound or guarding Extremities: no edema or skin discoloration or tenderness  Pelvic: Vulva: Normal             Vagina: No gross lesions or discharge  Cervix: No gross lesions or discharge.  Pap reflex done.  Uterus  AV, normal size, shape and consistency, non-tender and mobile  Adnexa  Without masses or tenderness  Anus: Normal   Assessment/Plan:  61 y.o. female for annual  exam   1. Encounter for routine gynecological examination with Papanicolaou smear of cervix Postmenopause, well without hormone replacement therapy.  No postmenopausal bleeding.  No pelvic pain. Normal vaginal secretions.  Pap ASCUS/HPV HR Neg again in 08/2021. 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  Superficial dyspareunia improved on Estradiol cream, would like to continue. Urine and bowel movements normal.  Breasts normal. Screening mammo 09/2021 Neg.  BMI 24.93.  Physically active. BD 11/2019 Osteopenia Lt Fem Neck -1.3.  Health labs with  family physician.  Screening colonoscopy 07/2020. Flu done at pharmacy. - Cytology - PAP( [place])  2. Mild dysplasia of cervix - Cytology - PAP( [place])  3. ASCUS of cervix with negative high risk HPV - Cytology - PAP( [place])  4. Postmenopause Postmenopause, well without hormone replacement therapy.  No postmenopausal bleeding.  No pelvic pain. Normal vaginal secretions.    5. Postmenopausal atrophic vaginitis Superficial dyspareunia improved on Estradiol cream, would like to continue. Prescription sent to pharmacy.  6. Osteopenia of multiple sites BMI 24.93.  Physically active. BD 11/2019 Osteopenia Lt Fem Neck -1.3. Taking Vit D supplements and good Ca++ intake.  Recommend repeating a BD at 3 yrs.  Other orders - VITAMIN D PO; Take by mouth. - estradiol (ESTRACE) 0.1 MG/GM vaginal cream; Place one gram intravaginally twice weekly.   [name] MD, 11:17 AM

## 2022-03-11 LAB — CYTOLOGY - PAP: Diagnosis: NEGATIVE

## 2022-03-25 ENCOUNTER — Ambulatory Visit (INDEPENDENT_AMBULATORY_CARE_PROVIDER_SITE_OTHER): Payer: Commercial Managed Care - HMO

## 2022-03-25 DIAGNOSIS — E538 Deficiency of other specified B group vitamins: Secondary | ICD-10-CM

## 2022-03-25 MED ORDER — CYANOCOBALAMIN 1000 MCG/ML IJ SOLN
1000.0000 ug | Freq: Once | INTRAMUSCULAR | Status: AC
  Administered 2022-03-25: 1000 ug via INTRAMUSCULAR

## 2022-03-25 NOTE — Progress Notes (Signed)
Per orders of Dr. Elease Hashimoto, injection of cyanocobalamin 1000 mcg/ml given by Encarnacion Slates on Left Deltoid.  Patient tolerated injection well.  Next injection is scheduled for 04/24/2022.

## 2022-04-24 ENCOUNTER — Ambulatory Visit (INDEPENDENT_AMBULATORY_CARE_PROVIDER_SITE_OTHER): Payer: Commercial Managed Care - HMO | Admitting: *Deleted

## 2022-04-24 DIAGNOSIS — E538 Deficiency of other specified B group vitamins: Secondary | ICD-10-CM

## 2022-04-24 MED ORDER — CYANOCOBALAMIN 1000 MCG/ML IJ SOLN
1000.0000 ug | Freq: Once | INTRAMUSCULAR | Status: AC
  Administered 2022-04-24: 1000 ug via INTRAMUSCULAR

## 2022-04-24 NOTE — Progress Notes (Signed)
Per orders of Dr. Hernandez, injection of B12 given by Jordis Repetto. Patient tolerated injection well.  

## 2022-05-28 ENCOUNTER — Telehealth: Payer: Self-pay | Admitting: Internal Medicine

## 2022-05-28 DIAGNOSIS — E538 Deficiency of other specified B group vitamins: Secondary | ICD-10-CM

## 2022-05-28 NOTE — Telephone Encounter (Signed)
Patient you are going to continue her vitamin D shots and what frequency. Thinks she has been doing them for a year

## 2022-05-28 NOTE — Telephone Encounter (Signed)
It's B 12, not vitamin D

## 2022-05-29 NOTE — Telephone Encounter (Signed)
Left message on machine for patient to schedule a lab appointment. Lab order placed.

## 2022-05-29 NOTE — Addendum Note (Signed)
Addended by: Westley Hummer B on: 05/29/2022 11:58 AM   Modules accepted: Orders

## 2022-05-30 ENCOUNTER — Ambulatory Visit (INDEPENDENT_AMBULATORY_CARE_PROVIDER_SITE_OTHER): Payer: Commercial Managed Care - HMO | Admitting: *Deleted

## 2022-05-30 ENCOUNTER — Encounter: Payer: Self-pay | Admitting: Internal Medicine

## 2022-05-30 DIAGNOSIS — E538 Deficiency of other specified B group vitamins: Secondary | ICD-10-CM

## 2022-05-30 MED ORDER — CYANOCOBALAMIN 1000 MCG/ML IJ SOLN
1000.0000 ug | Freq: Once | INTRAMUSCULAR | Status: AC
  Administered 2022-05-30: 1000 ug via INTRAMUSCULAR

## 2022-05-30 NOTE — Progress Notes (Signed)
Per orders of Dr. Michael, injection of Cyanocobalamin 1000mcg given by Dawnn Nam A. Patient tolerated injection well.  

## 2022-06-09 ENCOUNTER — Other Ambulatory Visit: Payer: Self-pay | Admitting: Internal Medicine

## 2022-06-09 DIAGNOSIS — E785 Hyperlipidemia, unspecified: Secondary | ICD-10-CM

## 2022-07-24 ENCOUNTER — Encounter: Payer: Self-pay | Admitting: Internal Medicine

## 2022-07-24 ENCOUNTER — Ambulatory Visit (INDEPENDENT_AMBULATORY_CARE_PROVIDER_SITE_OTHER): Payer: Commercial Managed Care - HMO | Admitting: Internal Medicine

## 2022-07-24 VITALS — BP 120/64 | HR 76 | Temp 98.1°F | Wt 158.9 lb

## 2022-07-24 DIAGNOSIS — H1132 Conjunctival hemorrhage, left eye: Secondary | ICD-10-CM

## 2022-07-24 NOTE — Progress Notes (Signed)
     Established Patient Office Visit     CC/Reason for Visit: Left eye bleeding  HPI: April Cowan is a 62 y.o. female who is coming in today for the above mentioned reasons.  On Sunday she noticed significant bleeding in the outer corner of her left eye.  She now has a ring of blood around her iris.  There is no vision disturbance or pain.  No trauma.   Past Medical/Surgical History: Past Medical History:  Diagnosis Date   Abnormal Pap smear of cervix    Anemia    taking B 12 injections   Hyperlipidemia    Raynaud's syndrome    Spider veins    Vaginal atrophy     Past Surgical History:  Procedure Laterality Date   CERVICAL BIOPSY  W/ LOOP ELECTRODE EXCISION     COLPOSCOPY      Social History:  reports that she has never smoked. She has never used smokeless tobacco. She reports current alcohol use. She reports that she does not use drugs.  Allergies: Allergies  Allergen Reactions   Sulfa Antibiotics     Family History:  Family History  Problem Relation Age of Onset   Parkinson's disease Mother        atypical   Hyperlipidemia Mother    Heart disease Father    Hypertension Father    Atrial fibrillation Father    Colon polyps Father    Lung cancer Sister    Arthritis Paternal Grandmother    Colon cancer Paternal Grandmother    Breast cancer Paternal Grandmother    Lung cancer Paternal Grandfather      Current Outpatient Medications:    atorvastatin (LIPITOR) 20 MG tablet, TAKE 1 TABLET BY MOUTH DAILY, Disp: 90 tablet, Rfl: 1   Cyanocobalamin (B-12 COMPLIANCE INJECTION IJ), Inject as directed., Disp: , Rfl:    estradiol (ESTRACE) 0.1 MG/GM vaginal cream, Place one gram intravaginally twice weekly., Disp: 42.5 g, Rfl: 4   VITAMIN D PO, Take by mouth., Disp: , Rfl:   Review of Systems:  Negative unless indicated in HPI.   Physical Exam: Vitals:   07/24/22 1559  BP: 120/64  Pulse: 76  Temp: 98.1 F (36.7 C)  TempSrc: Oral  SpO2: 98%   Weight: 158 lb 14.4 oz (72.1 kg)    Body mass index is 25.07 kg/m.   Physical Exam Eyes:       Impression and Plan:  Non-traumatic subconjunctival hemorrhage of left eye   -Observation only should resolve in about 10 days.  Time spent:20 minutes reviewing chart, interviewing and examining patient and formulating plan of care.     Chaya Jan, MD [facility]

## 2022-08-07 ENCOUNTER — Encounter: Payer: Self-pay | Admitting: Internal Medicine

## 2022-08-07 ENCOUNTER — Encounter: Payer: Commercial Managed Care - HMO | Admitting: Internal Medicine

## 2022-08-08 ENCOUNTER — Other Ambulatory Visit (INDEPENDENT_AMBULATORY_CARE_PROVIDER_SITE_OTHER): Payer: Commercial Managed Care - HMO

## 2022-08-08 DIAGNOSIS — E538 Deficiency of other specified B group vitamins: Secondary | ICD-10-CM

## 2022-08-08 LAB — VITAMIN B12: Vitamin B-12: 436 pg/mL (ref 211–911)

## 2022-08-22 ENCOUNTER — Encounter: Payer: Self-pay | Admitting: Obstetrics & Gynecology

## 2022-08-22 NOTE — Telephone Encounter (Signed)
P 12/29/19 ASCUS, HRHPV-neg P 02/18/2021 ASCUS, HRHPV-neg Colpo 03/05/2021 CIN 1 P 09/05/2021 ASCUS, HRHPV-neg P 02/28/2022 WNL  AEX due in 02/2023  Please advise.

## 2022-10-14 ENCOUNTER — Encounter: Payer: Commercial Managed Care - HMO | Admitting: Internal Medicine

## 2022-10-27 ENCOUNTER — Ambulatory Visit: Payer: Commercial Managed Care - HMO | Admitting: Internal Medicine

## 2022-10-27 ENCOUNTER — Encounter: Payer: Self-pay | Admitting: Internal Medicine

## 2022-10-27 VITALS — BP 110/80 | HR 64 | Temp 98.0°F | Ht 68.0 in | Wt 159.0 lb

## 2022-10-27 DIAGNOSIS — E538 Deficiency of other specified B group vitamins: Secondary | ICD-10-CM

## 2022-10-27 DIAGNOSIS — Z124 Encounter for screening for malignant neoplasm of cervix: Secondary | ICD-10-CM

## 2022-10-27 DIAGNOSIS — E782 Mixed hyperlipidemia: Secondary | ICD-10-CM

## 2022-10-27 DIAGNOSIS — Z0000 Encounter for general adult medical examination without abnormal findings: Secondary | ICD-10-CM

## 2022-10-27 DIAGNOSIS — E559 Vitamin D deficiency, unspecified: Secondary | ICD-10-CM | POA: Diagnosis not present

## 2022-10-27 DIAGNOSIS — Z1231 Encounter for screening mammogram for malignant neoplasm of breast: Secondary | ICD-10-CM

## 2022-10-27 LAB — COMPREHENSIVE METABOLIC PANEL
ALT: 16 U/L (ref 0–35)
AST: 16 U/L (ref 0–37)
Albumin: 4.4 g/dL (ref 3.5–5.2)
Alkaline Phosphatase: 60 U/L (ref 39–117)
BUN: 15 mg/dL (ref 6–23)
CO2: 28 mEq/L (ref 19–32)
Calcium: 9.3 mg/dL (ref 8.4–10.5)
Chloride: 106 mEq/L (ref 96–112)
Creatinine, Ser: 0.8 mg/dL (ref 0.40–1.20)
GFR: 77.84 mL/min (ref >60.00)
Glucose, Bld: 84 mg/dL (ref 70–99)
Potassium: 4.9 mEq/L (ref 3.5–5.1)
Sodium: 143 mEq/L (ref 135–145)
Total Bilirubin: 0.4 mg/dL (ref 0.2–1.2)
Total Protein: 7.5 g/dL (ref 6.0–8.3)

## 2022-10-27 LAB — LIPID PANEL
Cholesterol: 171 mg/dL (ref 0–200)
HDL: 69.9 mg/dL (ref >39.00)
LDL Cholesterol: 81 mg/dL (ref 0–99)
NonHDL: 101.23
Total CHOL/HDL Ratio: 2
Triglycerides: 99 mg/dL (ref 0.0–149.0)
VLDL: 19.8 mg/dL (ref 0.0–40.0)

## 2022-10-27 LAB — CBC WITH DIFFERENTIAL/PLATELET
Basophils Absolute: 0 10*3/uL (ref 0.0–0.1)
Basophils Relative: 1.1 % (ref 0.0–3.0)
Eosinophils Absolute: 0.1 10*3/uL (ref 0.0–0.7)
Eosinophils Relative: 3.3 % (ref 0.0–5.0)
HCT: 38.6 % (ref 36.0–46.0)
Hemoglobin: 13 g/dL (ref 12.0–15.0)
Lymphocytes Relative: 26.5 % (ref 12.0–46.0)
Lymphs Abs: 1.1 10*3/uL (ref 0.7–4.0)
MCHC: 33.8 g/dL (ref 30.0–36.0)
MCV: 93.3 fl (ref 78.0–100.0)
Monocytes Absolute: 0.3 10*3/uL (ref 0.1–1.0)
Monocytes Relative: 7 % (ref 3.0–12.0)
Neutro Abs: 2.7 10*3/uL (ref 1.4–7.7)
Neutrophils Relative %: 62.1 % (ref 43.0–77.0)
Platelets: 207 10*3/uL (ref 150.0–400.0)
RBC: 4.13 Mil/uL (ref 3.87–5.11)
RDW: 12.4 % (ref 11.5–15.5)
WBC: 4.3 10*3/uL (ref 4.0–10.5)

## 2022-10-27 LAB — VITAMIN B12: Vitamin B-12: 330 pg/mL (ref 211–911)

## 2022-10-27 LAB — HEMOGLOBIN A1C: Hgb A1c MFr Bld: 5.4 % (ref 4.6–6.5)

## 2022-10-27 LAB — TSH: TSH: 1.68 u[IU]/mL (ref 0.35–5.50)

## 2022-10-27 LAB — VITAMIN D 25 HYDROXY (VIT D DEFICIENCY, FRACTURES): VITD: 35.23 ng/mL (ref 30.00–100.00)

## 2022-10-27 NOTE — Progress Notes (Signed)
Established Patient Office Visit     CC/Reason for Visit: Annual preventive exam  HPI: April Cowan is a 62 y.o. female who is coming in today for the above mentioned reasons. Past Medical History is significant for: Hyperlipidemia, vitamin D and B12 deficiencies, history of ASCUS now due for Pap smear.  Request new GYN referral as previous has retired.  She has routine eye and dental care.  All immunizations are up-to-date.  She is now due for her annual screening mammogram, had a colonoscopy in 2022.  Her sister who was a never smoker was just diagnosed with lung cancer.   Past Medical/Surgical History: Past Medical History:  Diagnosis Date   Abnormal Pap smear of cervix    Anemia    taking B 12 injections   Hyperlipidemia    Raynaud's syndrome    Spider veins    Vaginal atrophy     Past Surgical History:  Procedure Laterality Date   CERVICAL BIOPSY  W/ LOOP ELECTRODE EXCISION     COLPOSCOPY      Social History:  reports that she has never smoked. She has never used smokeless tobacco. She reports current alcohol use. She reports that she does not use drugs.  Allergies: Allergies  Allergen Reactions   Sulfa Antibiotics     Family History:  Family History  Problem Relation Age of Onset   Parkinson's disease Mother        atypical   Hyperlipidemia Mother    Heart disease Father    Hypertension Father    Atrial fibrillation Father    Colon polyps Father    Lung cancer Sister    Arthritis Paternal Grandmother    Colon cancer Paternal Grandmother    Breast cancer Paternal Grandmother    Lung cancer Paternal Grandfather      Current Outpatient Medications:    atorvastatin (LIPITOR) 20 MG tablet, TAKE 1 TABLET BY MOUTH DAILY, Disp: 90 tablet, Rfl: 1   cyanocobalamin (VITAMIN B12) 1000 MCG tablet, Take 1,000 mcg by mouth daily., Disp: , Rfl:    estradiol (ESTRACE) 0.1 MG/GM vaginal cream, Place one gram intravaginally twice weekly., Disp: 42.5 g, Rfl:  4   VITAMIN D PO, Take by mouth., Disp: , Rfl:   Review of Systems:  Negative unless indicated in HPI.   Physical Exam: Vitals:   10/27/22 0805  BP: 110/80  Pulse: 64  Temp: 98 F (36.7 C)  TempSrc: Oral  SpO2: 99%  Weight: 159 lb (72.1 kg)  Height: 5\' 8"  (1.727 m)    Body mass index is 24.18 kg/m.   Physical Exam Vitals reviewed.  Constitutional:      General: She is not in acute distress.    Appearance: Normal appearance. She is not ill-appearing, toxic-appearing or diaphoretic.  HENT:     Head: Normocephalic.     Right Ear: Tympanic membrane, ear canal and external ear normal. There is no impacted cerumen.     Left Ear: Tympanic membrane, ear canal and external ear normal. There is no impacted cerumen.     Nose: Nose normal.     Mouth/Throat:     Mouth: Mucous membranes are moist.     Pharynx: Oropharynx is clear. No oropharyngeal exudate or posterior oropharyngeal erythema.  Eyes:     General: No scleral icterus.       Right eye: No discharge.        Left eye: No discharge.     Conjunctiva/sclera: Conjunctivae normal.  Pupils: Pupils are equal, round, and reactive to light.  Neck:     Vascular: No carotid bruit.  Cardiovascular:     Rate and Rhythm: Normal rate and regular rhythm.     Pulses: Normal pulses.     Heart sounds: Normal heart sounds.  Pulmonary:     Effort: Pulmonary effort is normal. No respiratory distress.     Breath sounds: Normal breath sounds.  Abdominal:     General: Abdomen is flat. Bowel sounds are normal.     Palpations: Abdomen is soft.  Musculoskeletal:        General: Normal range of motion.     Cervical back: Normal range of motion.  Skin:    General: Skin is warm and dry.  Neurological:     General: No focal deficit present.     Mental Status: She is alert and oriented to person, place, and time. Mental status is at baseline.  Psychiatric:        Mood and Affect: Mood normal.        Behavior: Behavior normal.         Thought Content: Thought content normal.        Judgment: Judgment normal.     Flowsheet Row Office Visit from 10/27/2022 in [facility] [place]  PHQ-9 Total Score 0        Impression and Plan:  Encounter for preventive health examination  Encounter for screening mammogram for malignant neoplasm of breast -     Digital Screening Mammogram, Left and Right; Future  Screening for cervical cancer -     Ambulatory referral to Obstetrics / Gynecology  Vitamin D deficiency -     VITAMIN D 25 Hydroxy (Vit-D Deficiency, Fractures); Future  Vitamin B12 deficiency -     Vitamin B12; Future -     Homocysteine; Future  Mixed hyperlipidemia -     CBC with Differential/Platelet; Future -     Comprehensive metabolic panel; Future -     Lipid panel; Future -     TSH; Future -     Hemoglobin A1c; Future   -Recommend routine eye and dental care. -Healthy lifestyle discussed in detail. -Labs to be updated today. -Prostate cancer screening: N/A Health Maintenance  Topic Date Due   COVID-19 Vaccine (8 - 2023-24 season) 02/18/2022   Pap Smear  08/30/2022   Flu Shot  10/23/2022   Colon Cancer Screening  07/24/2023   Mammogram  10/22/2023   DTaP/Tdap/Td vaccine (2 - Td or Tdap) 11/29/2025   Zoster (Shingles) Vaccine  Completed   Hepatitis C Screening  Addressed   HIV Screening  Addressed   HPV Vaccine  Aged Out    -All immunizations are up-to-date. -Referral for mammogram and GYN placed.     Chaya Jan, MD [facility]

## 2022-10-29 ENCOUNTER — Encounter: Payer: Self-pay | Admitting: Internal Medicine

## 2022-10-29 DIAGNOSIS — E538 Deficiency of other specified B group vitamins: Secondary | ICD-10-CM

## 2022-11-04 ENCOUNTER — Other Ambulatory Visit: Payer: Self-pay | Admitting: Internal Medicine

## 2022-11-04 DIAGNOSIS — Z1231 Encounter for screening mammogram for malignant neoplasm of breast: Secondary | ICD-10-CM

## 2022-11-12 DIAGNOSIS — Z1231 Encounter for screening mammogram for malignant neoplasm of breast: Secondary | ICD-10-CM

## 2022-11-17 ENCOUNTER — Ambulatory Visit: Admission: RE | Admit: 2022-11-17 | Payer: Commercial Managed Care - HMO | Source: Ambulatory Visit

## 2022-11-17 DIAGNOSIS — Z1231 Encounter for screening mammogram for malignant neoplasm of breast: Secondary | ICD-10-CM

## 2022-12-08 ENCOUNTER — Other Ambulatory Visit: Payer: Self-pay | Admitting: Internal Medicine

## 2022-12-08 DIAGNOSIS — E785 Hyperlipidemia, unspecified: Secondary | ICD-10-CM

## 2023-02-11 ENCOUNTER — Encounter: Payer: Self-pay | Admitting: Internal Medicine

## 2023-02-13 ENCOUNTER — Ambulatory Visit: Payer: Commercial Managed Care - HMO | Admitting: *Deleted

## 2023-02-13 DIAGNOSIS — Z23 Encounter for immunization: Secondary | ICD-10-CM | POA: Diagnosis not present

## 2023-02-13 DIAGNOSIS — E538 Deficiency of other specified B group vitamins: Secondary | ICD-10-CM

## 2023-02-13 MED ORDER — CYANOCOBALAMIN 1000 MCG/ML IJ SOLN
1000.0000 ug | Freq: Once | INTRAMUSCULAR | Status: AC
Start: 2023-02-13 — End: 2023-02-13
  Administered 2023-02-13: 1000 ug via INTRAMUSCULAR

## 2023-02-13 NOTE — Progress Notes (Signed)
Per orders of Dr. Casimiro Needle, injection of Cyanocobalamin given by Johnella Moloney. Patient tolerated injection well.

## 2023-03-20 ENCOUNTER — Ambulatory Visit (INDEPENDENT_AMBULATORY_CARE_PROVIDER_SITE_OTHER): Payer: Commercial Managed Care - HMO

## 2023-03-20 DIAGNOSIS — E538 Deficiency of other specified B group vitamins: Secondary | ICD-10-CM

## 2023-03-20 MED ORDER — CYANOCOBALAMIN 1000 MCG/ML IJ SOLN
1000.0000 ug | Freq: Once | INTRAMUSCULAR | Status: AC
Start: 2023-03-20 — End: 2023-03-20
  Administered 2023-03-20: 1000 ug via INTRAMUSCULAR

## 2023-03-20 NOTE — Progress Notes (Signed)
Per orders of Dr. Clent Ridges, injection of B12 given by Vickii Chafe on Left Deltoid. Patient tolerated injection well.

## 2023-04-16 ENCOUNTER — Ambulatory Visit: Payer: Commercial Managed Care - HMO | Admitting: Obstetrics and Gynecology

## 2023-04-27 DIAGNOSIS — D225 Melanocytic nevi of trunk: Secondary | ICD-10-CM | POA: Diagnosis not present

## 2023-04-27 DIAGNOSIS — L821 Other seborrheic keratosis: Secondary | ICD-10-CM | POA: Diagnosis not present

## 2023-04-27 DIAGNOSIS — L218 Other seborrheic dermatitis: Secondary | ICD-10-CM | POA: Diagnosis not present

## 2023-04-28 ENCOUNTER — Ambulatory Visit (INDEPENDENT_AMBULATORY_CARE_PROVIDER_SITE_OTHER): Payer: Medicare Other | Admitting: *Deleted

## 2023-04-28 DIAGNOSIS — E538 Deficiency of other specified B group vitamins: Secondary | ICD-10-CM | POA: Diagnosis not present

## 2023-04-28 MED ORDER — CYANOCOBALAMIN 1000 MCG/ML IJ SOLN
1000.0000 ug | Freq: Once | INTRAMUSCULAR | Status: AC
Start: 2023-04-28 — End: 2023-04-28
  Administered 2023-04-28: 1000 ug via INTRAMUSCULAR

## 2023-04-28 NOTE — Progress Notes (Signed)
Per orders of Dr. Hernandez, injection of B12 given by Dailyn Reith. Patient tolerated injection well.  

## 2023-06-02 ENCOUNTER — Ambulatory Visit (INDEPENDENT_AMBULATORY_CARE_PROVIDER_SITE_OTHER)

## 2023-06-02 DIAGNOSIS — E538 Deficiency of other specified B group vitamins: Secondary | ICD-10-CM | POA: Diagnosis not present

## 2023-06-02 MED ORDER — CYANOCOBALAMIN 1000 MCG/ML IJ SOLN
1000.0000 ug | Freq: Once | INTRAMUSCULAR | Status: AC
Start: 2023-06-02 — End: 2023-06-02
  Administered 2023-06-02: 1000 ug via INTRAMUSCULAR

## 2023-06-02 NOTE — Progress Notes (Signed)
 Per orders of Dr. Ardyth Harps, injection of B12 given by Kern Reap. Patient tolerated injection well.

## 2023-06-19 DIAGNOSIS — Z01419 Encounter for gynecological examination (general) (routine) without abnormal findings: Secondary | ICD-10-CM | POA: Diagnosis not present

## 2023-06-19 DIAGNOSIS — Z6826 Body mass index (BMI) 26.0-26.9, adult: Secondary | ICD-10-CM | POA: Diagnosis not present

## 2023-06-19 DIAGNOSIS — Z124 Encounter for screening for malignant neoplasm of cervix: Secondary | ICD-10-CM | POA: Diagnosis not present

## 2023-06-24 LAB — HM PAP SMEAR

## 2023-07-22 ENCOUNTER — Ambulatory Visit (INDEPENDENT_AMBULATORY_CARE_PROVIDER_SITE_OTHER)

## 2023-07-22 DIAGNOSIS — E538 Deficiency of other specified B group vitamins: Secondary | ICD-10-CM | POA: Diagnosis not present

## 2023-07-22 MED ORDER — CYANOCOBALAMIN 1000 MCG/ML IJ SOLN
1000.0000 ug | Freq: Once | INTRAMUSCULAR | Status: AC
Start: 2023-07-22 — End: 2023-07-22
  Administered 2023-07-22: 1000 ug via INTRAMUSCULAR

## 2023-07-22 NOTE — Progress Notes (Signed)
 Patient is in office today for a nurse visit for B12 Injection. Patient Injection was given in the  Left deltoid. Patient tolerated injection well.

## 2023-08-25 ENCOUNTER — Ambulatory Visit (INDEPENDENT_AMBULATORY_CARE_PROVIDER_SITE_OTHER)

## 2023-08-25 DIAGNOSIS — E538 Deficiency of other specified B group vitamins: Secondary | ICD-10-CM

## 2023-08-25 MED ORDER — CYANOCOBALAMIN 1000 MCG/ML IJ SOLN
1000.0000 ug | Freq: Once | INTRAMUSCULAR | Status: AC
Start: 2023-08-25 — End: 2023-08-25
  Administered 2023-08-25: 1000 ug via INTRAMUSCULAR

## 2023-08-25 NOTE — Progress Notes (Signed)
 Patient is in office today for a nurse visit for B12 Injection. Patient Injection was given in the  Right deltoid. Patient tolerated injection well.

## 2023-10-02 ENCOUNTER — Ambulatory Visit (INDEPENDENT_AMBULATORY_CARE_PROVIDER_SITE_OTHER)

## 2023-10-02 DIAGNOSIS — E538 Deficiency of other specified B group vitamins: Secondary | ICD-10-CM

## 2023-10-02 MED ORDER — CYANOCOBALAMIN 1000 MCG/ML IJ SOLN
1000.0000 ug | Freq: Once | INTRAMUSCULAR | Status: AC
Start: 2023-10-02 — End: 2023-10-02
  Administered 2023-10-02: 1000 ug via INTRAMUSCULAR

## 2023-10-02 NOTE — Progress Notes (Signed)
 Patient is in office today for a nurse visit for B12 Injection. Patient Injection was given in the  Left deltoid. Patient tolerated injection well.

## 2023-10-08 ENCOUNTER — Other Ambulatory Visit: Payer: Self-pay | Admitting: Internal Medicine

## 2023-10-08 DIAGNOSIS — Z1231 Encounter for screening mammogram for malignant neoplasm of breast: Secondary | ICD-10-CM

## 2023-11-02 ENCOUNTER — Ambulatory Visit

## 2023-11-06 ENCOUNTER — Ambulatory Visit (INDEPENDENT_AMBULATORY_CARE_PROVIDER_SITE_OTHER)

## 2023-11-06 DIAGNOSIS — E538 Deficiency of other specified B group vitamins: Secondary | ICD-10-CM | POA: Diagnosis not present

## 2023-11-06 MED ORDER — CYANOCOBALAMIN 1000 MCG/ML IJ SOLN
1000.0000 ug | Freq: Once | INTRAMUSCULAR | Status: AC
Start: 2023-11-06 — End: 2023-11-06
  Administered 2023-11-06: 1000 ug via INTRAMUSCULAR

## 2023-11-06 NOTE — Progress Notes (Signed)
 Patient is in office today for a nurse visit for B12 Injection. Patient Injection was given in the  Right deltoid. Patient tolerated injection well.

## 2023-11-13 ENCOUNTER — Encounter: Payer: Self-pay | Admitting: Gastroenterology

## 2023-11-13 ENCOUNTER — Ambulatory Visit

## 2023-11-13 VITALS — Ht 68.0 in | Wt 165.0 lb

## 2023-11-13 DIAGNOSIS — Z860100 Personal history of colon polyps, unspecified: Secondary | ICD-10-CM

## 2023-11-13 MED ORDER — NA SULFATE-K SULFATE-MG SULF 17.5-3.13-1.6 GM/177ML PO SOLN: 1.0000 | Freq: Once | ORAL | 0 refills | Status: AC

## 2023-11-13 NOTE — Progress Notes (Signed)

## 2023-11-16 ENCOUNTER — Ambulatory Visit: Payer: Self-pay | Admitting: *Deleted

## 2023-11-16 NOTE — Telephone Encounter (Signed)
 noted

## 2023-11-16 NOTE — Telephone Encounter (Signed)
 FYI Only or Action Required?: FYI only for provider.  Patient was last seen in primary care on 10/27/2022 by Theophilus Andrews, Tully GRADE, MD.  Called Nurse Triage reporting swelling under arm.  Symptoms began several weeks ago.  Interventions attempted: Nothing.  Symptoms are: unchanged.  Triage Disposition: See Physician Within 24 Hours  Patient/caregiver understands and will follow disposition?: yes    Reason for Disposition  [1] Single large node AND [2] size > 1 inch (2.5 cm) AND [3] no fever  Answer Assessment - Initial Assessment Questions 1. LOCATION: Where is the swollen node located? Is the matching node on the other side of the body also swollen?      Left armpit, other side normal 2. SIZE: How big is the node? (e.g., inches or centimeters; or compared to common objects such as pea, bean, marble, golf ball)      1 1/2 inch- puffy, reddish spot 3. ONSET: When did the swelling start?      Noticed 2 weeks ago- thought bra related 4. NECK NODES: Is there a sore throat, runny nose or other symptoms of a cold?      no 5. GROIN OR ARMPIT NODES: Is there a sore, scratch, cut or painful red area on that arm or leg?      Painful red area- under arm, left groin - if down on floor- feels uncomfortable 6. FEVER: Do you have a fever? If Yes, ask: What is it, how was it measured, and when did it start?      no 7. CAUSE: What do you think is causing the swollen lymph nodes?     unsure 8. OTHER SYMPTOMS: Do you have any other symptoms? (e.g., node is tender to touch, skin redness over node, weight changes)     Tender, reddish in color  Protocols used: Lymph Nodes - Swollen-A-AH   Copied from CRM #[id number]. Topic: Clinical - Red Word Triage >> Nov 16, 2023  9:34 AM Marissa P wrote: Red Word that prompted transfer to Nurse Triage: Patient has been having some pain and tenderness in her left armpit area and would like to come by this week to get it looked please.

## 2023-11-17 ENCOUNTER — Ambulatory Visit (INDEPENDENT_AMBULATORY_CARE_PROVIDER_SITE_OTHER): Admitting: Internal Medicine

## 2023-11-17 VITALS — BP 110/80 | HR 67 | Temp 97.3°F | Wt 173.3 lb

## 2023-11-17 DIAGNOSIS — M79622 Pain in left upper arm: Secondary | ICD-10-CM

## 2023-11-17 NOTE — Progress Notes (Signed)
     Established Patient Office Visit     CC/Reason for Visit: Left armpit, upper breast pain  HPI: April Cowan is a 63 y.o. female who is coming in today for the above mentioned reasons.  For the past week she has noticed left armpit pain.  Initially said that she thought she had felt a lump and it is painful to touch.  She has her screening mammogram scheduled for tomorrow.   Past Medical/Surgical History: Past Medical History:  Diagnosis Date   Abnormal Pap smear of cervix    Anemia    taking B 12 injections   Hyperlipidemia    Raynaud's syndrome    Spider veins    Vaginal atrophy     Past Surgical History:  Procedure Laterality Date   CERVICAL BIOPSY  W/ LOOP ELECTRODE EXCISION     COLONOSCOPY     COLPOSCOPY     NM RENAL LASIX ([facility] HX)      Social History:  reports that she has never smoked. She has never used smokeless tobacco. She reports current alcohol use. She reports that she does not use drugs.  Allergies: Allergies  Allergen Reactions   Sulfa Antibiotics Hives    Family History:  Family History  Problem Relation Age of Onset   Esophageal cancer Mother    Parkinson's disease Mother        atypical   Hyperlipidemia Mother    Heart disease Father    Hypertension Father    Atrial fibrillation Father    Colon polyps Father    Lung cancer Sister    Arthritis Paternal Grandmother    Colon cancer Paternal Grandmother    Breast cancer Paternal Grandmother    Lung cancer Paternal Grandfather    Rectal cancer Neg Hx    Stomach cancer Neg Hx      Current Outpatient Medications:    atorvastatin  (LIPITOR) 20 MG tablet, TAKE 1 TABLET BY MOUTH DAILY, Disp: 90 tablet, Rfl: 1   estradiol  (ESTRACE ) 0.1 MG/GM vaginal cream, Place one gram intravaginally twice weekly., Disp: 42.5 g, Rfl: 4   VITAMIN D  PO, Take by mouth., Disp: , Rfl:   Review of Systems:  Negative unless indicated in HPI.   Physical Exam: Vitals:   11/17/23 1553  BP: 110/80   Pulse: 67  Temp: (!) 97.3 F (36.3 C)  TempSrc: Oral  SpO2: 99%  Weight: 173 lb 4.8 oz (78.6 kg)    Body mass index is 26.35 kg/m.   Impression and Plan:  Axillary pain, left -     MM Digital Diagnostic Unilat L; Future -     US  BREAST COMPLETE UNI LEFT INC AXILLA; Future  -No lump, erythema evident on exam today.  Will add a left breast diagnostic image to her screening images for tomorrow.   Time spent:22 minutes reviewing chart, interviewing and examining patient and formulating plan of care.     Tully Theophilus Andrews, MD [facility]

## 2023-11-18 ENCOUNTER — Ambulatory Visit
Admission: RE | Admit: 2023-11-18 | Discharge: 2023-11-18 | Disposition: A | Discharge status: Home / Self Care | Source: Ambulatory Visit | Attending: Internal Medicine | Admitting: Internal Medicine

## 2023-11-18 ENCOUNTER — Encounter: Payer: Self-pay | Admitting: Internal Medicine

## 2023-11-18 DIAGNOSIS — Z1231 Encounter for screening mammogram for malignant neoplasm of breast: Secondary | ICD-10-CM

## 2023-11-24 ENCOUNTER — Ambulatory Visit
Admission: RE | Admit: 2023-11-24 | Discharge: 2023-11-24 | Disposition: A | Discharge status: Home / Self Care | Source: Ambulatory Visit | Attending: Internal Medicine | Admitting: Internal Medicine

## 2023-11-24 DIAGNOSIS — M79622 Pain in left upper arm: Secondary | ICD-10-CM

## 2023-11-24 DIAGNOSIS — R92323 Mammographic fibroglandular density, bilateral breasts: Secondary | ICD-10-CM | POA: Diagnosis not present

## 2023-11-24 DIAGNOSIS — R928 Other abnormal and inconclusive findings on diagnostic imaging of breast: Secondary | ICD-10-CM | POA: Diagnosis not present

## 2023-11-27 ENCOUNTER — Other Ambulatory Visit: Payer: Self-pay | Admitting: Gastroenterology

## 2023-11-27 ENCOUNTER — Ambulatory Visit: Admitting: Gastroenterology

## 2023-11-27 ENCOUNTER — Encounter: Payer: Self-pay | Admitting: Gastroenterology

## 2023-11-27 VITALS — BP 136/80 | HR 66 | Temp 97.9°F | Resp 18 | Ht 68.0 in | Wt 165.0 lb

## 2023-11-27 DIAGNOSIS — K648 Other hemorrhoids: Secondary | ICD-10-CM | POA: Diagnosis not present

## 2023-11-27 DIAGNOSIS — D122 Benign neoplasm of ascending colon: Secondary | ICD-10-CM

## 2023-11-27 DIAGNOSIS — K5730 Diverticulosis of large intestine without perforation or abscess without bleeding: Secondary | ICD-10-CM | POA: Diagnosis not present

## 2023-11-27 DIAGNOSIS — Z860100 Personal history of colon polyps, unspecified: Secondary | ICD-10-CM

## 2023-11-27 DIAGNOSIS — Z1211 Encounter for screening for malignant neoplasm of colon: Secondary | ICD-10-CM | POA: Diagnosis not present

## 2023-11-27 DIAGNOSIS — Z860101 Personal history of adenomatous and serrated colon polyps: Secondary | ICD-10-CM

## 2023-11-27 DIAGNOSIS — K635 Polyp of colon: Secondary | ICD-10-CM | POA: Diagnosis not present

## 2023-11-27 MED ORDER — SODIUM CHLORIDE 0.9 % IV SOLN: 500.0000 mL | Freq: Once | INTRAVENOUS | Status: DC

## 2023-11-27 NOTE — Progress Notes (Signed)
 Called to room to assist during endoscopic procedure.  Patient ID and intended procedure confirmed with present staff. Received instructions for my participation in the procedure from the performing physician.

## 2023-11-27 NOTE — Progress Notes (Signed)
 [facility] History and Physical   Primary Care Physician:  Theophilus Andrews, Tully GRADE, MD   Reason for Procedure:   History of colon polyps  Plan:    colonoscopy     HPI: April Cowan is a 63 y.o. female  here for colonoscopy surveillance - advanced adenoma removed 07/2020 - Dr. Aneita  . Patient denies any bowel symptoms at this time. No family history of colon cancer known in first degree relatives, her grandmother did have CRC. Otherwise feels well without any cardiopulmonary symptoms.   I have discussed risks / benefits of anesthesia and endoscopic procedure with [name] and they wish to proceed with the exams as outlined today.    Past Medical History:  Diagnosis Date   Abnormal Pap smear of cervix    Anemia    taking B 12 injections   Hyperlipidemia    Raynaud's syndrome    Spider veins    Vaginal atrophy     Past Surgical History:  Procedure Laterality Date   CERVICAL BIOPSY  W/ LOOP ELECTRODE EXCISION     COLONOSCOPY     COLPOSCOPY     NM RENAL LASIX ([facility] HX)      Prior to Admission medications   Medication Sig Start Date End Date Taking? Authorizing Provider  atorvastatin  (LIPITOR) 20 MG tablet TAKE 1 TABLET BY MOUTH DAILY 12/08/22  Yes Theophilus Andrews, Tully GRADE, MD  cyanocobalamin  (VITAMIN B12) 1000 MCG/ML injection Inject 1,000 mcg into the muscle.   Yes [provider]  estradiol  (ESTRACE ) 0.1 MG/GM vaginal cream Place one gram intravaginally twice weekly. 02/28/22  Yes Lavoie, Marie-Lyne, MD  VITAMIN D  PO Take by mouth.   Yes [provider]    Current Outpatient Medications  Medication Sig Dispense Refill   atorvastatin  (LIPITOR) 20 MG tablet TAKE 1 TABLET BY MOUTH DAILY 90 tablet 1   cyanocobalamin  (VITAMIN B12) 1000 MCG/ML injection Inject 1,000 mcg into the muscle.     estradiol  (ESTRACE ) 0.1 MG/GM vaginal cream Place one gram intravaginally twice weekly. 42.5 g 4   VITAMIN D  PO Take by mouth.      Current Facility-Administered Medications  Medication Dose Route Frequency Provider Last Rate Last Admin   0.9 %  sodium chloride  infusion  500 mL Intravenous Once Kaoru Rezendes, Elspeth SQUIBB, MD        Allergies as of 11/27/2023 - Review Complete 11/27/2023  Allergen Reaction Noted   Sulfa antibiotics Hives 07/03/2017    Family History  Problem Relation Age of Onset   Esophageal cancer Mother    Parkinson's disease Mother        atypical   Hyperlipidemia Mother    Heart disease Father    Hypertension Father    Atrial fibrillation Father    Colon polyps Father    Lung cancer Sister    Arthritis Paternal Grandmother    Colon cancer Paternal Grandmother    Breast cancer Paternal Grandmother    Lung cancer Paternal Grandfather    Rectal cancer Neg Hx    Stomach cancer Neg Hx     Social History   Socioeconomic History   Marital status: Married    Spouse name: Not on file   Number of children: Not on file   Years of education: Not on file   Highest education level: Bachelor's degree (e.g., BA, AB, BS)  Occupational History   Not on file  Tobacco Use   Smoking status: Never   Smokeless tobacco: Never  Vaping  Use   Vaping status: Never Used  Substance and Sexual Activity   Alcohol use: Yes    Comment: occ   Drug use: Never   Sexual activity: Yes    Partners: Male    Birth control/protection: Post-menopausal    Comment: 1st intercourse- 14, partners- 5  Other Topics Concern   Not on file  Social History Narrative   Work or School: Tourist information centre manager Situation: lives with husband and daughter      Spiritual Beliefs: Christian      Lifestyle: exercises a few days per week, diet is good            Social Drivers of Corporate investment banker Strain: Low Risk  (11/17/2023)   Overall Financial Resource Strain (CARDIA)    Difficulty of Paying Living Expenses: Not hard at all  Food Insecurity: No Food Insecurity (11/17/2023)   Hunger Vital Sign    Worried About  Running Out of Food in the Last Year: Never true    Ran Out of Food in the Last Year: Never true  Transportation Needs: No Transportation Needs (11/17/2023)   PRAPARE - Administrator, Civil Service (Medical): No    Lack of Transportation (Non-Medical): No  Physical Activity: Sufficiently Active (11/17/2023)   Exercise Vital Sign    Days of Exercise per Week: 6 days    Minutes of Exercise per Session: 60 min  Stress: No Stress Concern Present (11/17/2023)   [employer] of Occupational Health - Occupational Stress Questionnaire    Feeling of Stress: Only a little  Social Connections: Socially Integrated (11/17/2023)   Social Connection and Isolation Panel    Frequency of Communication with Friends and Family: More than three times a week    Frequency of Social Gatherings with Friends and Family: Twice a week    Attends Religious Services: 1 to 4 times per year    Active Member of [employer] or Organizations: Yes    Attends Engineer, structural: More than 4 times per year    Marital Status: Married  Catering manager Violence: Not on file    Review of Systems: All other review of systems negative except as mentioned in the HPI.  Physical Exam: Vital signs BP 134/83 (BP Location: Right Arm, Patient Position: Sitting, Cuff Size: Normal)   Pulse 64   Temp 97.9 F (36.6 C) (Temporal)   Ht 5' 8 (1.727 m)   Wt 165 lb (74.8 kg)   LMP 04/07/2012   SpO2 97%   BMI 25.09 kg/m   General:   Alert,  Well-developed, pleasant and cooperative in NAD Lungs:  Clear throughout to auscultation.   Heart:  Regular rate and rhythm Abdomen:  Soft, nontender and nondistended.   Neuro/Psych:  Alert and cooperative. Normal mood and affect. A and O x 3  Marcey Naval, MD [facility]

## 2023-11-27 NOTE — Op Note (Signed)
 [facility] Patient Name: April Cowan Procedure Date: 11/27/2023 2:42 PM MRN: [id number] Endoscopist: Elspeth P. Leigh , MD, [id number] Age: 63 Referring MD:  Date of Birth: 04-16-1960 Gender: Female Account #: [id number] Procedure:                Colonoscopy Indications:              High risk colon cancer surveillance: Personal                            history of colonic polyps - 3 polyps removed 06/2020                            with Dr. Aneita including one polyp > 1 cm in size Medicines:                Monitored Anesthesia Care Procedure:                Pre-Anesthesia Assessment:                           - Prior to the procedure, a History and Physical                            was performed, and patient medications and                            allergies were reviewed. The patient's tolerance of                            previous anesthesia was also reviewed. The risks                            and benefits of the procedure and the sedation                            options and risks were discussed with the patient.                            All questions were answered, and informed consent                            was obtained. Prior Anticoagulants: The patient has                            taken no anticoagulant or antiplatelet agents. ASA                            Grade Assessment: II - A patient with mild systemic                            disease. After reviewing the risks and benefits,                            the patient was deemed in satisfactory condition to  undergo the procedure.                           After obtaining informed consent, the colonoscope                            was passed under direct vision. Throughout the                            procedure, the patient's blood pressure, pulse, and                            oxygen saturations were monitored continuously. The                             [id number]-HQ190L Colonoscope [id number] was introduced                            through the anus and advanced to the the cecum,                            identified by appendiceal orifice and ileocecal                            valve. The colonoscopy was performed without                            difficulty. The patient tolerated the procedure                            well. The quality of the bowel preparation was                            adequate. The ileocecal valve, appendiceal orifice,                            and rectum were photographed. Scope In: 2:56:08 PM Scope Out: 3:10:24 PM Scope Withdrawal Time: 0 hours 11 minutes 20 seconds  Total Procedure Duration: 0 hours 14 minutes 16 seconds  Findings:                 The perianal and digital rectal examinations were                            normal.                           Multiple diverticula were found in the entire colon.                           A 12 to 14 mm polyp was found in the ascending                            colon. The polyp was flat. The polyp was removed  with a cold snare. Resection and retrieval were                            complete.                           Internal hemorrhoids were found during                            retroflexion. The hemorrhoids were small.                           The exam was otherwise without abnormality. Complications:            No immediate complications. Estimated blood loss:                            Minimal. Estimated Blood Loss:     Estimated blood loss was minimal. Impression:               - Diverticulosis in the entire examined colon.                           - One 12 to 14 mm polyp in the ascending colon,                            removed with a cold snare. Resected and retrieved.                           - Internal hemorrhoids.                           - The examination was otherwise normal. Recommendation:           - Patient has a  contact number available for                            emergencies. The signs and symptoms of potential                            delayed complications were discussed with the                            patient. Return to normal activities tomorrow.                            Written discharge instructions were provided to the                            patient.                           - Resume previous diet.                           - Continue present medications.                           -  Await pathology results. Elspeth P. Jean Skow, MD 11/27/2023 3:18:55 PM This report has been signed electronically.

## 2023-11-27 NOTE — Progress Notes (Signed)
 Vitals-DT  Pt's states no medical or surgical changes since previsit or office visit.

## 2023-11-27 NOTE — Patient Instructions (Addendum)
-  Handout on polyp, diverticulosis provided. -await pathology results. -repeat colonoscopy for surveillance recommended. Date to be determined when pathology result become available.  -Continue present medications.  YOU HAD AN ENDOSCOPIC PROCEDURE TODAY AT THE [facility]:   Refer to the procedure report that was given to you for any specific questions about what was found during the examination.  If the procedure report does not answer your questions, please call your gastroenterologist to clarify.  If you requested that your care partner not be given the details of your procedure findings, then the procedure report has been included in a sealed envelope for you to review at your convenience later.  YOU SHOULD EXPECT: Some feelings of bloating in the abdomen. Passage of more gas than usual.  Walking can help get rid of the air that was put into your GI tract during the procedure and reduce the bloating. If you had a lower endoscopy (such as a colonoscopy or flexible sigmoidoscopy) you may notice spotting of blood in your stool or on the toilet paper. If you underwent a bowel prep for your procedure, you may not have a normal bowel movement for a few days.  Please Note:  You might notice some irritation and congestion in your nose or some drainage.  This is from the oxygen used during your procedure.  There is no need for concern and it should clear up in a day or so.  SYMPTOMS TO REPORT IMMEDIATELY:  Following lower endoscopy (colonoscopy or flexible sigmoidoscopy):  Excessive amounts of blood in the stool  Significant tenderness or worsening of abdominal pains  Swelling of the abdomen that is new, acute  Fever of 100F or higher  For urgent or emergent issues, a gastroenterologist can be reached at any hour by calling [phone]. Do not use MyChart messaging for urgent concerns.    DIET:  We do recommend a small meal at first, but then you may proceed to your regular diet.   Drink plenty of fluids but you should avoid alcoholic beverages for 24 hours.  ACTIVITY:  You should plan to take it easy for the rest of today and you should NOT DRIVE or use heavy machinery until tomorrow (because of the sedation medicines used during the test).    FOLLOW UP: Our staff will call the number listed on your records the next business day following your procedure.  We will call around 7:15- 8:00 am to check on you and address any questions or concerns that you may have regarding the information given to you following your procedure. If we do not reach you, we will leave a message.     If any biopsies were taken you will be contacted by phone or by letter within the next 1-3 weeks.  Please call us  at [phone] if you have not heard about the biopsies in 3 weeks.    SIGNATURES/CONFIDENTIALITY: You and/or your care partner have signed paperwork which will be entered into your electronic medical record.  These signatures attest to the fact that that the information above on your After Visit Summary has been reviewed and is understood.  Full responsibility of the confidentiality of this discharge information lies with you and/or your care-partner.

## 2023-11-27 NOTE — Progress Notes (Signed)
 Report given to PACU, vss

## 2023-11-30 ENCOUNTER — Telehealth: Payer: Self-pay | Admitting: *Deleted

## 2023-11-30 ENCOUNTER — Encounter: Payer: Self-pay | Admitting: Gastroenterology

## 2023-11-30 NOTE — Telephone Encounter (Signed)
  Follow up Call-     11/27/2023    2:13 PM 11/27/2023    2:05 PM  Call back number  Post procedure Call Back phone  # [phone]   Permission to leave phone message  Yes     Patient questions:  Do you have a fever, pain , or abdominal swelling? No. Pain Score  0 *  Have you tolerated food without any problems? Yes.    Have you been able to return to your normal activities? Yes.    Do you have any questions about your discharge instructions: Diet   No. Medications  No. Follow up visit  No.  Do you have questions or concerns about your Care? No.  Actions: * If pain score is 4 or above: No action needed, pain <4.

## 2023-12-02 ENCOUNTER — Ambulatory Visit: Payer: Self-pay | Admitting: Gastroenterology

## 2023-12-02 LAB — SURGICAL PATHOLOGY

## 2023-12-24 ENCOUNTER — Telehealth: Payer: Self-pay | Admitting: *Deleted

## 2023-12-24 NOTE — Telephone Encounter (Signed)
 Copied from CRM #[id number]. Topic: Appointments - Scheduling Inquiry for Clinic >> Dec 24, 2023 10:57 AM Revonda D wrote: Reason for CRM: Pt is requesting to have a B12 shot and would like to get an appt scheduled. Pt also wanted to schedule a flu shot appt but wanted to wait until the B12 shot was approved.

## 2023-12-24 NOTE — Telephone Encounter (Signed)
 Left message on machine for patient to call back and schedule her B12 injection.

## 2023-12-25 ENCOUNTER — Ambulatory Visit: Admitting: *Deleted

## 2023-12-25 DIAGNOSIS — E538 Deficiency of other specified B group vitamins: Secondary | ICD-10-CM

## 2023-12-25 MED ORDER — CYANOCOBALAMIN 1000 MCG/ML IJ SOLN
1000.0000 ug | Freq: Once | INTRAMUSCULAR | Status: AC
Start: 2023-12-25 — End: 2023-12-25
  Administered 2023-12-25: 1000 ug via INTRAMUSCULAR

## 2023-12-25 NOTE — Progress Notes (Signed)
 Per orders of Dr. Casimiro Needle, injection of Cyanocobalamin given by Johnella Moloney. Patient tolerated injection well.

## 2024-01-14 ENCOUNTER — Encounter: Payer: Self-pay | Admitting: Internal Medicine

## 2024-01-14 ENCOUNTER — Ambulatory Visit: Admitting: Internal Medicine

## 2024-01-14 ENCOUNTER — Ambulatory Visit: Payer: Self-pay | Admitting: Internal Medicine

## 2024-01-14 VITALS — BP 120/80 | HR 70 | Temp 98.4°F | Ht 67.0 in | Wt 175.2 lb

## 2024-01-14 DIAGNOSIS — E785 Hyperlipidemia, unspecified: Secondary | ICD-10-CM | POA: Diagnosis not present

## 2024-01-14 DIAGNOSIS — Z780 Asymptomatic menopausal state: Secondary | ICD-10-CM

## 2024-01-14 DIAGNOSIS — E559 Vitamin D deficiency, unspecified: Secondary | ICD-10-CM

## 2024-01-14 DIAGNOSIS — Z0000 Encounter for general adult medical examination without abnormal findings: Secondary | ICD-10-CM | POA: Diagnosis not present

## 2024-01-14 DIAGNOSIS — E538 Deficiency of other specified B group vitamins: Secondary | ICD-10-CM

## 2024-01-14 LAB — CBC WITH DIFFERENTIAL/PLATELET
Basophils Absolute: 0 K/uL (ref 0.0–0.1)
Basophils Relative: 0.9 % (ref 0.0–3.0)
Eosinophils Absolute: 0.1 K/uL (ref 0.0–0.7)
Eosinophils Relative: 2.7 % (ref 0.0–5.0)
HCT: 38.2 % (ref 36.0–46.0)
Hemoglobin: 12.9 g/dL (ref 12.0–15.0)
Lymphocytes Relative: 10.8 % — ABNORMAL LOW (ref 12.0–46.0)
Lymphs Abs: 0.6 K/uL — ABNORMAL LOW (ref 0.7–4.0)
MCHC: 33.7 g/dL (ref 30.0–36.0)
MCV: 91.8 fl (ref 78.0–100.0)
Monocytes Absolute: 0.4 K/uL (ref 0.1–1.0)
Monocytes Relative: 7.3 % (ref 3.0–12.0)
Neutro Abs: 4 K/uL (ref 1.4–7.7)
Neutrophils Relative %: 78.3 % — ABNORMAL HIGH (ref 43.0–77.0)
Platelets: 173 K/uL (ref 150.0–400.0)
RBC: 4.17 Mil/uL (ref 3.87–5.11)
RDW: 14 % (ref 11.5–15.5)
WBC: 5.1 K/uL (ref 4.0–10.5)

## 2024-01-14 LAB — COMPREHENSIVE METABOLIC PANEL WITH GFR
ALT: 12 U/L (ref 0–35)
AST: 14 U/L (ref 0–37)
Albumin: 4.4 g/dL (ref 3.5–5.2)
Alkaline Phosphatase: 71 U/L (ref 39–117)
BUN: 17 mg/dL (ref 6–23)
CO2: 28 meq/L (ref 19–32)
Calcium: 9.5 mg/dL (ref 8.4–10.5)
Chloride: 102 meq/L (ref 96–112)
Creatinine, Ser: 0.75 mg/dL (ref 0.40–1.20)
GFR: 83.39 mL/min (ref >60.00)
Glucose, Bld: 92 mg/dL (ref 70–99)
Potassium: 4.6 meq/L (ref 3.5–5.1)
Sodium: 140 meq/L (ref 135–145)
Total Bilirubin: 0.6 mg/dL (ref 0.2–1.2)
Total Protein: 7.6 g/dL (ref 6.0–8.3)

## 2024-01-14 LAB — LIPID PANEL
Cholesterol: 196 mg/dL (ref 0–200)
HDL: 81.9 mg/dL
LDL Cholesterol: 94 mg/dL (ref 0–99)
NonHDL: 114.59
Total CHOL/HDL Ratio: 2
Triglycerides: 102 mg/dL (ref 0.0–149.0)
VLDL: 20.4 mg/dL (ref 0.0–40.0)

## 2024-01-14 LAB — TSH: TSH: 2 u[IU]/mL (ref 0.35–5.50)

## 2024-01-14 LAB — VITAMIN B12: Vitamin B-12: 298 pg/mL (ref 211–911)

## 2024-01-14 LAB — VITAMIN D 25 HYDROXY (VIT D DEFICIENCY, FRACTURES): VITD: 24.9 ng/mL — ABNORMAL LOW (ref 30.00–100.00)

## 2024-01-14 MED ORDER — VITAMIN D (ERGOCALCIFEROL) 1.25 MG (50000 UNIT) PO CAPS: 50000.0000 [IU] | ORAL_CAPSULE | ORAL | 0 refills | Status: DC

## 2024-01-14 NOTE — Progress Notes (Signed)
 Established Patient Office Visit     CC/Reason for Visit: Welcome to Medicare visit and annual preventive exam  HPI: April Cowan is a 63 y.o. female who is coming in today for the above mentioned reasons. Past Medical History is significant for: Hyperlipidemia, vitamin D  and B12 deficiencies.  Feeling well, no acute concerns or complaints.  Has routine eye and dental care.  Just had her COVID-vaccine yesterday, due for flu and pneumonia.  Had her colonoscopy earlier this year and is a 3-year callback due to polyps.  Mammogram and Pap smear up-to-date.  Due for DEXA.  Her mother and her sister died earlier this year, she is interested in a calcium  CT.   Past Medical/Surgical History: Past Medical History:  Diagnosis Date   Abnormal Pap smear of cervix    Anemia    taking B 12 injections   Hyperlipidemia    Raynaud's syndrome    Spider veins    Vaginal atrophy     Past Surgical History:  Procedure Laterality Date   CERVICAL BIOPSY  W/ LOOP ELECTRODE EXCISION     COLONOSCOPY     COLPOSCOPY     NM RENAL LASIX ([facility] HX)      Social History:  reports that she has never smoked. She has never used smokeless tobacco. She reports current alcohol use. She reports that she does not use drugs.  Allergies: Allergies  Allergen Reactions   Sulfa Antibiotics Hives    Family History:  Family History  Problem Relation Age of Onset   Esophageal cancer Mother    Parkinson's disease Mother        atypical   Hyperlipidemia Mother    Heart disease Father    Hypertension Father    Atrial fibrillation Father    Colon polyps Father    Lung cancer Sister    Arthritis Paternal Grandmother    Colon cancer Paternal Grandmother    Breast cancer Paternal Grandmother    Lung cancer Paternal Grandfather    Rectal cancer Neg Hx    Stomach cancer Neg Hx      Current Outpatient Medications:    atorvastatin  (LIPITOR) 20 MG tablet, TAKE 1 TABLET BY MOUTH DAILY, Disp: 90 tablet,  Rfl: 1   cyanocobalamin  (VITAMIN B12) 1000 MCG/ML injection, Inject 1,000 mcg into the muscle., Disp: , Rfl:    estradiol  (ESTRACE ) 0.1 MG/GM vaginal cream, Place one gram intravaginally twice weekly., Disp: 42.5 g, Rfl: 4   VITAMIN D  PO, Take by mouth., Disp: , Rfl:   Review of Systems:  Negative unless indicated in HPI.   Physical Exam: Vitals:   01/14/24 0739  BP: 120/80  Pulse: 70  Temp: 98.4 F (36.9 C)  TempSrc: Oral  SpO2: 96%  Weight: 175 lb 3.2 oz (79.5 kg)  Height: 5' 7 (1.702 m)    Body mass index is 27.44 kg/m.   Physical Exam Vitals reviewed.  Constitutional:      General: She is not in acute distress.    Appearance: Normal appearance. She is not ill-appearing, toxic-appearing or diaphoretic.  HENT:     Head: Normocephalic.     Right Ear: Tympanic membrane, ear canal and external ear normal. There is no impacted cerumen.     Left Ear: Tympanic membrane, ear canal and external ear normal. There is no impacted cerumen.     Nose: Nose normal.     Mouth/Throat:     Mouth: Mucous membranes are moist.  Pharynx: Oropharynx is clear. No oropharyngeal exudate or posterior oropharyngeal erythema.  Eyes:     General: No scleral icterus.       Right eye: No discharge.        Left eye: No discharge.     Conjunctiva/sclera: Conjunctivae normal.     Pupils: Pupils are equal, round, and reactive to light.  Neck:     Vascular: No carotid bruit.  Cardiovascular:     Rate and Rhythm: Normal rate and regular rhythm.     Pulses: Normal pulses.     Heart sounds: Normal heart sounds.  Pulmonary:     Effort: Pulmonary effort is normal. No respiratory distress.     Breath sounds: Normal breath sounds.  Abdominal:     General: Abdomen is flat. Bowel sounds are normal.     Palpations: Abdomen is soft.  Musculoskeletal:        General: Normal range of motion.     Cervical back: Normal range of motion.  Skin:    General: Skin is warm and dry.  Neurological:      General: No focal deficit present.     Mental Status: She is alert and oriented to person, place, and time. Mental status is at baseline.  Psychiatric:        Mood and Affect: Mood normal.        Behavior: Behavior normal.        Thought Content: Thought content normal.        Judgment: Judgment normal.   Welcome to Medicare wellness visit   1. Risk factors, based on past  M,S,F - Cardiac Risk Factors include: advanced age (>28men, >15 women);dyslipidemia   2.  Physical activities: Dietary issues and exercise activities discussed:      3.  Depression/mood:  Flowsheet Row Office Visit from 01/14/2024 in [facility] Total Score 0     4.  ADL's:    01/14/2024    7:34 AM  In your present state of health, do you have any difficulty performing the following activities:  Hearing? 0  Vision? 0  Difficulty concentrating or making decisions? 0  Walking or climbing stairs? 0  Dressing or bathing? 0  Doing errands, shopping? 0  Preparing Food and eating ? N  Using the Toilet? N  In the past six months, have you accidently leaked urine? N  Do you have problems with loss of bowel control? N  Managing your Medications? N  Managing your Finances? N  Housekeeping or managing your Housekeeping? N     5.  Fall risk:     11/18/2021    3:56 PM 07/24/2022    4:27 PM 10/27/2022    8:11 AM 11/17/2023    4:14 PM 01/14/2024    7:36 AM  Fall Risk  Falls in the past year? 0 0 0 1 1  Was there an injury with Fall? 0 0 0 0 0  Fall Risk Category Calculator 0 0 0 1 1  Fall Risk Category (Retired) Low       (RETIRED) Patient Fall Risk Level Low fall risk       Patient at Risk for Falls Due to No Fall Risks      Fall risk Follow up Falls evaluation completed  Falls evaluation completed Falls evaluation completed Falls evaluation completed Falls evaluation completed     Data saved with a previous flowsheet row definition     6.  Home safety: No  problems  identified   7.  Height weight, and visual acuity: height and weight as above, vision/hearing: Vision Screening   Right eye Left eye Both eyes  Without correction     With correction 20/20 20/20 20/20      8.  Counseling: Counseling given: Not Answered    9. Lab orders based on risk factors: Laboratory update will be reviewed   10. Cognitive assessment:        01/14/2024    7:37 AM  6CIT Screen  What Year? 0 points  What month? 0 points  What time? 0 points  Count back from 20 0 points  Months in reverse 0 points  Repeat phrase 0 points  Total Score 0 points     11. Screening: Patient provided with a written and personalized 5-10 year screening schedule in the AVS. Health Maintenance  Topic Date Due   Pneumococcal Vaccine for age over 91 (1 of 1 - PCV) Never done   Pap Smear  08/30/2022   Flu Shot  10/23/2023   COVID-19 Vaccine (8 - 2025-26 season) 11/23/2023   Medicare Annual Wellness Visit  01/13/2025   Breast Cancer Screening  11/23/2025   DTaP/Tdap/Td vaccine (2 - Td or Tdap) 11/29/2025   Colon Cancer Screening  11/27/2026   DEXA scan (bone density measurement)  Completed   Zoster (Shingles) Vaccine  Completed   Hepatitis C Screening  Addressed   HIV Screening  Addressed   Hepatitis B Vaccine  Aged Out   Meningitis B Vaccine  Aged Out    12. Provider List Update: Patient Care Team    Relationship Specialty Notifications Start End  Theophilus Andrews, Tully GRADE, MD PCP - General Internal Medicine  04/05/20   Lavoie, Marie-Lyne, MD Consulting Physician Obstetrics and Gynecology  09/05/21      13. Advance Directives: Does Patient Have a Medical Advance Directive?: Yes Type of Advance Directive: Healthcare Power of Attorney, Living will, Out of facility DNR (pink MOST or yellow form) Does patient want to make changes to medical advance directive?: No - Patient declined Copy of Healthcare Power of Attorney in Chart?: No - copy requested  14. Opioids: Patient is  not on any opioid prescriptions and has no risk factors for a substance use disorder.   15.   Goals      Weight (lb) < 200 lb (90.7 kg)         I have personally reviewed and noted the following in the patient's chart:   Medical and social history Use of alcohol, tobacco or illicit drugs  Current medications and supplements Functional ability and status Nutritional status Physical activity Advanced directives List of other physicians Hospitalizations, surgeries, and ER visits in previous 12 months Vitals Screenings to include cognitive, depression, and falls Referrals and appointments  In addition, I have reviewed and discussed with patient certain preventive protocols, quality metrics, and best practice recommendations. A written personalized care plan for preventive services as well as general preventive health recommendations were provided to patient.   Impression and Plan:  Welcome to Medicare preventive visit  Vitamin B12 deficiency -     Vitamin B12; Future  Hyperlipidemia, unspecified hyperlipidemia type -     CBC with Differential/Platelet; Future -     Comprehensive metabolic panel with GFR; Future -     Lipid panel; Future -     TSH; Future -     CT CARDIAC SCORING (SELF PAY ONLY); Future  Vitamin D  deficiency -  VITAMIN D  25 Hydroxy (Vit-D Deficiency, Fractures); Future  B12 deficiency  Postmenopausal estrogen deficiency -     DG Bone Density; Future   -Recommend routine eye and dental care. -Healthy lifestyle discussed in detail. -Labs to be updated today. -Prostate cancer screening: N/A Health Maintenance  Topic Date Due   Pneumococcal Vaccine for age over 19 (1 of 1 - PCV) Never done   Pap Smear  08/30/2022   Flu Shot  10/23/2023   COVID-19 Vaccine (8 - 2025-26 season) 11/23/2023   Medicare Annual Wellness Visit  01/13/2025   Breast Cancer Screening  11/23/2025   DTaP/Tdap/Td vaccine (2 - Td or Tdap) 11/29/2025   Colon Cancer Screening   11/27/2026   DEXA scan (bone density measurement)  Completed   Zoster (Shingles) Vaccine  Completed   Hepatitis C Screening  Addressed   HIV Screening  Addressed   Hepatitis B Vaccine  Aged Out   Meningitis B Vaccine  Aged Out     - Will obtain flu and PCV 20 at a later date. - DEXA scan requested. - Calcium  CT ordered per request.    Tully Theophilus Andrews, MD [facility]

## 2024-01-15 ENCOUNTER — Encounter: Payer: Self-pay | Admitting: Family Medicine

## 2024-01-15 ENCOUNTER — Ambulatory Visit: Admitting: Family Medicine

## 2024-01-15 VITALS — BP 120/80 | HR 68 | Temp 97.9°F | Resp 16 | Ht 67.0 in | Wt 175.6 lb

## 2024-01-15 DIAGNOSIS — H00012 Hordeolum externum right lower eyelid: Secondary | ICD-10-CM

## 2024-01-15 DIAGNOSIS — E559 Vitamin D deficiency, unspecified: Secondary | ICD-10-CM

## 2024-01-15 MED ORDER — VITAMIN D (ERGOCALCIFEROL) 1.25 MG (50000 UNIT) PO CAPS: 50000.0000 [IU] | ORAL_CAPSULE | ORAL | 0 refills | Status: AC

## 2024-01-15 MED ORDER — ERYTHROMYCIN 5 MG/GM OP OINT: 1.0000 | TOPICAL_OINTMENT | Freq: Two times a day (BID) | OPHTHALMIC | 0 refills | Status: AC

## 2024-01-15 NOTE — Progress Notes (Signed)
 ACUTE VISIT Chief Complaint  Patient presents with   Acute Visit    Stye on eye    HPI: April.April Cowan is a 63 y.o. female with past medical history significant for hyperlipidemia, B12 deficiency, vitamin D  deficiency, who is here today complaining of tender lesion on loer right eye lid. Problem noted yesterday morning, rapidly progressed to a small bump at the end of the day.  Eye Problem  The right eye is affected. This is a new problem. The current episode started yesterday. The problem has been gradually worsening. The pain is mild. There is No known exposure to pink eye. She Does not wear contacts. Associated symptoms include eye redness. Pertinent negatives include no blurred vision, eye discharge, double vision, fever, foreign body sensation, itching, nausea, photophobia, recent URI, vomiting or weakness. She has tried nothing for the symptoms.   Negative for associated headache, fever, chills, conjunctival erythema, eye drainage, changes in vision, nausea, or vomiting. No history of trauma. She has not tried OTC treatments. Had similar problem many years ago. She is established with ophthalmologist.  Review of Systems  Constitutional:  Negative for activity change, appetite change and fever.  HENT:  Negative for mouth sores and sore throat.   Eyes:  Positive for redness. Negative for blurred vision, double vision, photophobia, discharge and itching.  Respiratory:  Negative for cough and shortness of breath.   Gastrointestinal:  Negative for nausea and vomiting.  Musculoskeletal:  Negative for gait problem and myalgias.  Skin:  Negative for rash.  Neurological:  Negative for syncope and weakness.  See other pertinent positives and negatives in HPI.  Current Outpatient Medications on File Prior to Visit  Medication Sig Dispense Refill   atorvastatin  (LIPITOR) 20 MG tablet TAKE 1 TABLET BY MOUTH DAILY 90 tablet 1   cyanocobalamin  (VITAMIN B12) 1000 MCG/ML injection  Inject 1,000 mcg into the muscle.     estradiol  (ESTRACE ) 0.1 MG/GM vaginal cream Place one gram intravaginally twice weekly. 42.5 g 4   VITAMIN D  PO Take by mouth.     No current facility-administered medications on file prior to visit.    Past Medical History:  Diagnosis Date   Abnormal Pap smear of cervix    Anemia    taking B 12 injections   Hyperlipidemia    Raynaud's syndrome    Spider veins    Vaginal atrophy    Allergies  Allergen Reactions   Sulfa Antibiotics Hives   Social History   Socioeconomic History   Marital status: Married    Spouse name: Not on file   Number of children: Not on file   Years of education: Not on file   Highest education level: Bachelor's degree (e.g., BA, AB, BS)  Occupational History   Not on file  Tobacco Use   Smoking status: Never   Smokeless tobacco: Never  Vaping Use   Vaping status: Never Used  Substance and Sexual Activity   Alcohol use: Yes    Comment: occ   Drug use: Never   Sexual activity: Yes    Partners: Male    Birth control/protection: Post-menopausal    Comment: 1st intercourse- 14, partners- 5  Other Topics Concern   Not on file  Social History Narrative   Work or School: Tourist information centre manager Situation: lives with husband and daughter      Spiritual Beliefs: Christian      Lifestyle: exercises a few days per week, diet is good  Social Drivers of Corporate investment banker Strain: Low Risk  (01/14/2024)   Overall Financial Resource Strain (CARDIA)    Difficulty of Paying Living Expenses: Not hard at all  Food Insecurity: No Food Insecurity (01/14/2024)   Hunger Vital Sign    Worried About Running Out of Food in the Last Year: Never true    Ran Out of Food in the Last Year: Never true  Transportation Needs: No Transportation Needs (01/14/2024)   PRAPARE - Administrator, Civil Service (Medical): No    Lack of Transportation (Non-Medical): No  Physical Activity: Sufficiently  Active (01/14/2024)   Exercise Vital Sign    Days of Exercise per Week: 6 days    Minutes of Exercise per Session: 60 min  Stress: No Stress Concern Present (01/14/2024)   [employer] of Occupational Health - Occupational Stress Questionnaire    Feeling of Stress: Not at all  Social Connections: Socially Integrated (01/14/2024)   Social Connection and Isolation Panel    Frequency of Communication with Friends and Family: More than three times a week    Frequency of Social Gatherings with Friends and Family: More than three times a week    Attends Religious Services: 1 to 4 times per year    Active Member of [employer] or Organizations: Yes    Attends Engineer, structural: More than 4 times per year    Marital Status: Married    Vitals:   01/15/24 1003  BP: 120/80  Pulse: 68  Resp: 16  Temp: 97.9 F (36.6 C)  SpO2: 97%   Body mass index is 27.5 kg/m.  Physical Exam Vitals and nursing note reviewed.  Constitutional:      Appearance: Normal appearance.  HENT:     Head: Normocephalic and atraumatic.     Mouth/Throat:     Lips: No lesions.     Mouth: Mucous membranes are moist.     Pharynx: Oropharynx is clear.  Eyes:     Extraocular Movements: Extraocular movements intact.     Pupils: Pupils are equal, round, and reactive to light.   Pulmonary:     Effort: Pulmonary effort is normal. No respiratory distress.  Lymphadenopathy:     Head:     Right side of head: No preauricular adenopathy.     Left side of head: No preauricular adenopathy.     Cervical: No cervical adenopathy.  Neurological:     Mental Status: She is alert.   ASSESSMENT AND PLAN:  April Cowan was seen today for lesion on lower right eye lid.  Hordeolum externum of right lower eyelid We discussed diagnosis, prognosis, and treatment options. History and examination do not suggest a serious process. Explained that most of the time lesion is sterile but still recommend topical antibiotic  ointment twice daily for 7 days. The main part of the treatment is local heat, educated about how to do it, a few times throughout the day. Monitor for new symptoms. If problem does not resolve in a couple weeks, she can arrange appointment with her ophthalmologist. Instructed about warning signs.  -     Erythromycin; Place 1 Application into the right eye 2 (two) times daily for 7 days.  Dispense: 14 g; Refill: 0  Return if symptoms worsen or fail to improve.  Lazer Wollard G. [place], MD  [facility]. [place] office.

## 2024-01-18 ENCOUNTER — Encounter: Payer: Self-pay | Admitting: Internal Medicine

## 2024-01-26 ENCOUNTER — Encounter: Admitting: Internal Medicine

## 2024-02-11 ENCOUNTER — Ambulatory Visit (HOSPITAL_BASED_OUTPATIENT_CLINIC_OR_DEPARTMENT_OTHER)
Admission: RE | Admit: 2024-02-11 | Discharge: 2024-02-11 | Disposition: A | Discharge status: Home / Self Care | Source: Ambulatory Visit | Attending: Internal Medicine | Admitting: Internal Medicine

## 2024-02-11 DIAGNOSIS — M85852 Other specified disorders of bone density and structure, left thigh: Secondary | ICD-10-CM | POA: Diagnosis not present

## 2024-02-11 DIAGNOSIS — Z780 Asymptomatic menopausal state: Secondary | ICD-10-CM | POA: Diagnosis not present

## 2024-02-25 ENCOUNTER — Other Ambulatory Visit (HOSPITAL_BASED_OUTPATIENT_CLINIC_OR_DEPARTMENT_OTHER)

## 2024-02-26 ENCOUNTER — Ambulatory Visit (HOSPITAL_BASED_OUTPATIENT_CLINIC_OR_DEPARTMENT_OTHER)
Admission: RE | Admit: 2024-02-26 | Discharge: 2024-02-26 | Disposition: A | Payer: Self-pay | Source: Ambulatory Visit | Attending: Internal Medicine | Admitting: Internal Medicine

## 2024-02-26 DIAGNOSIS — E785 Hyperlipidemia, unspecified: Secondary | ICD-10-CM

## 2024-03-14 MED ORDER — ATORVASTATIN CALCIUM 40 MG PO TABS: 40.0000 mg | ORAL_TABLET | Freq: Every day | ORAL | 1 refills | Status: DC

## 2024-03-14 MED ORDER — ATORVASTATIN CALCIUM 40 MG PO TABS: 40.0000 mg | ORAL_TABLET | Freq: Every day | ORAL | 1 refills | Status: AC

## 2024-04-15 ENCOUNTER — Ambulatory Visit

## 2024-04-15 DIAGNOSIS — E538 Deficiency of other specified B group vitamins: Secondary | ICD-10-CM | POA: Diagnosis not present

## 2024-04-15 MED ORDER — CYANOCOBALAMIN 1000 MCG/ML IJ SOLN
1000.0000 ug | Freq: Once | INTRAMUSCULAR | Status: AC
  Administered 2024-04-15: 1000 ug via INTRAMUSCULAR

## 2024-04-15 NOTE — Progress Notes (Signed)
 Patient is in office today for a nurse visit for B12 Injection. Patient Injection was given in the  Right deltoid. Patient tolerated injection well.

## 2024-05-17 ENCOUNTER — Ambulatory Visit

## 2024-05-17 ENCOUNTER — Other Ambulatory Visit
